# Patient Record
Sex: Male | Born: 1970 | Race: White | Hispanic: No | Smoking: Current every day smoker
Health system: Southern US, Community
[De-identification: ages and names within clinical notes are randomized; demographics above are authoritative.]

## PROBLEM LIST (undated history)

## (undated) DIAGNOSIS — T7840XA Allergy, unspecified, initial encounter: Secondary | ICD-10-CM

## (undated) DIAGNOSIS — M1990 Unspecified osteoarthritis, unspecified site: Secondary | ICD-10-CM

## (undated) HISTORY — DX: Unspecified osteoarthritis, unspecified site: M19.90

## (undated) HISTORY — PX: FACIAL FRACTURE SURGERY: SHX1570

## (undated) HISTORY — DX: Allergy, unspecified, initial encounter: T78.40XA

## (undated) HISTORY — PX: COLON SURGERY: SHX602

## (undated) HISTORY — PX: FRACTURE SURGERY: SHX138

---

## 2001-08-26 ENCOUNTER — Emergency Department (HOSPITAL_COMMUNITY): Admission: EM | Admit: 2001-08-26 | Discharge: 2001-08-26 | Payer: Self-pay | Admitting: Emergency Medicine

## 2002-05-31 HISTORY — PX: HEMORROIDECTOMY: SUR656

## 2003-05-16 ENCOUNTER — Ambulatory Visit (HOSPITAL_COMMUNITY): Admission: RE | Admit: 2003-05-16 | Discharge: 2003-05-16 | Payer: Self-pay | Admitting: General Surgery

## 2003-05-16 ENCOUNTER — Encounter (INDEPENDENT_AMBULATORY_CARE_PROVIDER_SITE_OTHER): Payer: Self-pay | Admitting: *Deleted

## 2007-10-01 ENCOUNTER — Emergency Department (HOSPITAL_COMMUNITY): Admission: EM | Admit: 2007-10-01 | Discharge: 2007-10-01 | Payer: Self-pay | Admitting: Emergency Medicine

## 2007-10-18 ENCOUNTER — Ambulatory Visit (HOSPITAL_COMMUNITY): Admission: RE | Admit: 2007-10-18 | Discharge: 2007-10-18 | Payer: Self-pay | Admitting: Cardiology

## 2007-10-18 ENCOUNTER — Encounter (INDEPENDENT_AMBULATORY_CARE_PROVIDER_SITE_OTHER): Payer: Self-pay | Admitting: Cardiology

## 2007-10-19 ENCOUNTER — Ambulatory Visit (HOSPITAL_COMMUNITY): Admission: RE | Admit: 2007-10-19 | Discharge: 2007-10-19 | Payer: Self-pay | Admitting: Cardiology

## 2007-11-03 ENCOUNTER — Ambulatory Visit: Payer: Self-pay | Admitting: Oncology

## 2007-11-17 LAB — CBC WITH DIFFERENTIAL/PLATELET
Basophils Absolute: 0 10*3/uL (ref 0.0–0.1)
Eosinophils Absolute: 0.1 10*3/uL (ref 0.0–0.5)
HCT: 50.7 % — ABNORMAL HIGH (ref 38.7–49.9)
HGB: 18.1 g/dL — ABNORMAL HIGH (ref 13.0–17.1)
LYMPH%: 24.1 % (ref 14.0–48.0)
MCV: 97 fL (ref 81.6–98.0)
MONO#: 0.5 10*3/uL (ref 0.1–0.9)
MONO%: 7.6 % (ref 0.0–13.0)
NEUT#: 4.5 10*3/uL (ref 1.5–6.5)
NEUT%: 66 % (ref 40.0–75.0)
Platelets: 217 10*3/uL (ref 145–400)

## 2007-11-17 LAB — MORPHOLOGY: PLT EST: ADEQUATE

## 2007-11-18 LAB — SEDIMENTATION RATE: Sed Rate: 2 mm/hr (ref 0–16)

## 2007-11-21 LAB — COMPREHENSIVE METABOLIC PANEL
ALT: 17 U/L (ref 0–53)
AST: 18 U/L (ref 0–37)
CO2: 23 mEq/L (ref 19–32)
Calcium: 9.8 mg/dL (ref 8.4–10.5)
Chloride: 107 mEq/L (ref 96–112)
Sodium: 142 mEq/L (ref 135–145)
Total Protein: 7.7 g/dL (ref 6.0–8.3)

## 2007-11-21 LAB — LACTATE DEHYDROGENASE: LDH: 128 U/L (ref 94–250)

## 2007-11-21 LAB — URIC ACID: Uric Acid, Serum: 6.7 mg/dL (ref 4.0–7.8)

## 2007-11-21 LAB — JAK2 EXONS 12 & 13 MUTATION, REFLEX: JAK2 Exons 12 & 13: 13

## 2007-11-24 ENCOUNTER — Ambulatory Visit: Admission: RE | Admit: 2007-11-24 | Discharge: 2007-11-24 | Payer: Self-pay | Admitting: Oncology

## 2007-12-05 LAB — CBC WITH DIFFERENTIAL/PLATELET
Basophils Absolute: 0 10*3/uL (ref 0.0–0.1)
Eosinophils Absolute: 0 10*3/uL (ref 0.0–0.5)
HCT: 48.4 % (ref 38.7–49.9)
MCH: 34.4 pg — ABNORMAL HIGH (ref 28.0–33.4)
MONO#: 0.5 10*3/uL (ref 0.1–0.9)
MONO%: 8.2 % (ref 0.0–13.0)
Platelets: 201 10*3/uL (ref 145–400)
RDW: 13.6 % (ref 11.2–14.6)
lymph#: 1.4 10*3/uL (ref 0.9–3.3)

## 2008-03-04 ENCOUNTER — Ambulatory Visit: Payer: Self-pay | Admitting: Oncology

## 2008-06-04 ENCOUNTER — Ambulatory Visit: Payer: Self-pay | Admitting: Oncology

## 2010-10-16 NOTE — Op Note (Signed)
NAME:  Gary Pratt, Gary Pratt                         ACCOUNT NO.:  [id number]   MEDICAL RECORD NO.:  [id number]                   PATIENT TYPE:  AMB   LOCATION:  DAY                                  FACILITY:  [facility]   PHYSICIAN:  Timothy E. Earlene Plater, M.D.              DATE OF BIRTH:  11/20/1970   DATE OF PROCEDURE:  05/16/2003  DATE OF DISCHARGE:                                 OPERATIVE REPORT   PREOPERATIVE DIAGNOSES:  Internal and external hemorrhoids.   POSTOPERATIVE DIAGNOSES:  Internal and external hemorrhoids.   PROCEDURE:  Hemorrhoidectomy.   SURGEON:  Timothy E. Earlene Plater, M.D.   ANESTHESIA:  General.   Mr. Frazer is 30, otherwise healthy, thin and without other risk factors who  has developed over time considerable pain, protrusion, soilage, bleeding  with hemorrhoids.  He has been seen and counseled in the office and wishes  to proceed with this surgery which has been carefully explained.  He was  identified, permit signed, evaluated by anesthesia.   He was taken to the operating room, placed supine, LMA anesthesia provided.  He was placed in lithotomy, perianal area inspected, prepped and draped in  the usual fashion. A large internal and external right anterior hemorrhoid  was present with easy prolapse.  There were four external tags located in  approximately the 3, 5, 7, and 9 o'clock positions.  The perianal area was  injected around and about with Marcaine, epinephrine, and Wydase and  massaged in well.  The external hemorrhoidal tags were clipped off flush  with the skin, the superficial varicosities removed.  The right anterior  internal and external hemorrhoid was excised in a standard fashion avoiding  the sphincter of course and the wound was closed with a running 2-0 chromic  including the portion of excised external skin.  The areas of tag excision  were closed with interrupted and/or running 3-0 plain suture.  The results  were satisfactory, the sphincter was intact,  there was no bleeding or  complication. Gelfoam gauze and a dry sterile dressing applied. He tolerated  it well and was removed to the recovery room in good condition.   Written and verbal instructions were given including Percocet 5 mg #36.  He  will be seen and followed as an outpatient.                                               Timothy E. Earlene Plater, M.D.    TED/MEDQ  D:  05/16/2003  T:  05/16/2003  Job:  [id number]

## 2011-02-25 LAB — BLOOD GAS, ARTERIAL
Acid-Base Excess: 0.8
Drawn by: 10179
pCO2 arterial: 31.3 — ABNORMAL LOW
pH, Arterial: 7.478 — ABNORMAL HIGH
pO2, Arterial: 106 — ABNORMAL HIGH

## 2020-12-24 ENCOUNTER — Other Ambulatory Visit: Payer: Self-pay | Admitting: *Deleted

## 2020-12-24 DIAGNOSIS — Z87891 Personal history of nicotine dependence: Secondary | ICD-10-CM

## 2020-12-24 DIAGNOSIS — F17210 Nicotine dependence, cigarettes, uncomplicated: Secondary | ICD-10-CM

## 2021-01-26 ENCOUNTER — Ambulatory Visit
Admission: RE | Admit: 2021-01-26 | Discharge: 2021-01-26 | Disposition: A | Discharge status: Home / Self Care | Payer: 59 | Source: Ambulatory Visit | Attending: Acute Care | Admitting: Acute Care

## 2021-01-26 ENCOUNTER — Other Ambulatory Visit: Payer: Self-pay

## 2021-01-26 ENCOUNTER — Telehealth (INDEPENDENT_AMBULATORY_CARE_PROVIDER_SITE_OTHER): Payer: 59 | Admitting: Acute Care

## 2021-01-26 ENCOUNTER — Encounter: Payer: Self-pay | Admitting: Acute Care

## 2021-01-26 DIAGNOSIS — F17210 Nicotine dependence, cigarettes, uncomplicated: Secondary | ICD-10-CM

## 2021-01-26 DIAGNOSIS — Z87891 Personal history of nicotine dependence: Secondary | ICD-10-CM

## 2021-01-26 NOTE — Progress Notes (Signed)
Shared Decision Making Visit Lung Cancer Screening Program [phone])   Eligibility: Age 50 y.o. Pack Years Smoking History Calculation 24 pack year smoking history (# packs/per year x # years smoked) Recent History of coughing up blood  no Unexplained weight loss? no ( >Than 15 pounds within the last 6 months ) Prior History Lung / other cancer no (Diagnosis within the last 5 years already requiring surveillance chest CT Scans). Smoking Status Current Smoker Former Smokers: Years since quit: NA  Quit Date: NA  Visit Components: Discussion included one or more decision making aids. yes Discussion included risk/benefits of screening. yes Discussion included potential follow up diagnostic testing for abnormal scans. yes Discussion included meaning and risk of over diagnosis. yes Discussion included meaning and risk of False Positives. yes Discussion included meaning of total radiation exposure. yes  Counseling Included: Importance of adherence to annual lung cancer LDCT screening. yes Impact of comorbidities on ability to participate in the program. yes Ability and willingness to under diagnostic treatment. yes  Smoking Cessation Counseling: Current Smokers:  Discussed importance of smoking cessation. yes Information about tobacco cessation classes and interventions provided to patient. yes Patient provided with "ticket" for LDCT Scan. yes Symptomatic Patient. no  Counseling NA Diagnosis Code: Tobacco Use Z72.0 Asymptomatic Patient yes  Counseling (Intermediate counseling: > three minutes counseling) E8315 Former Smokers:  Discussed the importance of maintaining cigarette abstinence. yes Diagnosis Code: Personal History of Nicotine Dependence. V76.160 Information about tobacco cessation classes and interventions provided to patient. Yes Patient provided with "ticket" for LDCT Scan. yes Written Order for Lung Cancer Screening with LDCT placed in Epic. Yes (CT Chest Lung Cancer  Screening Low Dose W/O CM) [id number] Z12.2-Screening of respiratory organs Z87.891-Personal history of nicotine dependence  I have spent 25 minutes of face to face time with Gary Pratt discussing the risks and benefits of lung cancer screening. We viewed a power point together that explained in detail the above noted topics. We paused at intervals to allow for questions to be asked and answered to ensure understanding.We discussed that the single most powerful action that he can take to decrease his risk of developing lung cancer is to quit smoking. We discussed whether or not he is ready to commit to setting a quit date. We discussed options for tools to aid in quitting smoking including nicotine replacement therapy, non-nicotine medications, support groups, Quit Smart classes, and behavior modification. We discussed that often times setting smaller, more achievable goals, such as eliminating 1 cigarette a day for a week and then 2 cigarettes a day for a week can be helpful in slowly decreasing the number of cigarettes smoked. This allows for a sense of accomplishment as well as providing a clinical benefit. I gave him the " Be Stronger Than Your Excuses" card with contact information for community resources, classes, free nicotine replacement therapy, and access to mobile apps, text messaging, and on-line smoking cessation help. I have also given him my card and contact information in the event he needs to contact me. We discussed the time and location of the scan, and that either Abigail Miyamoto RN or I will call with the results within 24-48 hours of receiving them. I have offered him  a copy of the power point we viewed  as a resource in the event they need reinforcement of the concepts we discussed today in the office. The patient verbalized understanding of all of  the above and had no further questions upon leaving the office. They  have my contact information in the event they have any further  questions.  I spent 3 minutes counseling on smoking cessation and the health risks of continued tobacco abuse.  I explained to the patient that there has been a high incidence of coronary artery disease noted on these exams. I explained that this is a non-gated exam therefore degree or severity cannot be determined. This patient is not currently on statin therapy. I have asked the patient to follow-up with their PCP regarding any incidental finding of coronary artery disease and management with diet or medication as their PCP  feels is clinically indicated. The patient verbalized understanding of the above and had no further questions upon completion of the visit.      Bevelyn Ngo, NP 01/26/2021

## 2021-01-26 NOTE — Patient Instructions (Signed)
Thank you for participating in the [facility] Screening Program. It was our pleasure to meet you today. We will call you with the results of your scan within the next few days. Your scan will be assigned a Lung RADS category score by the physicians reading the scans.  This Lung RADS score determines follow up scanning.  See below for description of categories, and follow up screening recommendations. We will be in touch to schedule your follow up screening annually or based on recommendations of our providers. We will fax a copy of your scan results to your Primary Care Physician, or the physician who referred you to the program, to ensure they have the results. Please call the office if you have any questions or concerns regarding your scanning experience or results.  Our office number is [phone]. Please speak with Denise Phelps, RN. She is our Lung Cancer Screening RN. If she is unavailable when you call, please have the office staff send her a message. She will return your call at her earliest convenience. Remember, if your scan is normal, we will scan you annually as long as you continue to meet the criteria for the program. (Age 55-77, Current smoker or smoker who has quit within the last 15 years). If you are a smoker, remember, quitting is the single most powerful action that you can take to decrease your risk of lung cancer and other pulmonary, breathing related problems. We know quitting is hard, and we are here to help.  Please let us know if there is anything we can do to help you meet your goal of quitting. If you are a former smoker, congratulations. We are proud of you! Remain smoke free! Remember you can refer friends or family members through the number above.  We will screen them to make sure they meet criteria for the program. Thank you for helping us take better care of you by participating in Lung Screening.  Lung RADS Categories:  Lung RADS 1: no nodules  or definitely non-concerning nodules.  Recommendation is for a repeat annual scan in 12 months.  Lung RADS 2:  nodules that are non-concerning in appearance and behavior with a very low likelihood of becoming an active cancer. Recommendation is for a repeat annual scan in 12 months.  Lung RADS 3: nodules that are probably non-concerning , includes nodules with a low likelihood of becoming an active cancer.  Recommendation is for a 6-month repeat screening scan. Often noted after an upper respiratory illness. We will be in touch to make sure you have no questions, and to schedule your 6-month scan.  Lung RADS 4 A: nodules with concerning findings, recommendation is most often for a follow up scan in 3 months or additional testing based on our provider's assessment of the scan. We will be in touch to make sure you have no questions and to schedule the recommended 3 month follow up scan.  Lung RADS 4 B:  indicates findings that are concerning. We will be in touch with you to schedule additional diagnostic testing based on our provider's  assessment of the scan.   

## 2021-02-05 NOTE — Progress Notes (Signed)
Please call patient and let them  know their  low dose Ct was read as a Lung RADS 1, negative study: no nodules or definitely benign nodules. Radiology recommendation is for a repeat LDCT in 12 months. .Please let them  know we will order and schedule their  annual screening scan for 12/2021.  Please place order for annual  screening scan for  12/2021 and fax results to PCP. Thanks so much. 

## 2021-02-06 ENCOUNTER — Encounter: Payer: Self-pay | Admitting: *Deleted

## 2021-02-06 DIAGNOSIS — F17210 Nicotine dependence, cigarettes, uncomplicated: Secondary | ICD-10-CM

## 2021-02-06 DIAGNOSIS — Z87891 Personal history of nicotine dependence: Secondary | ICD-10-CM

## 2021-03-10 NOTE — Progress Notes (Signed)
Cardiology Office Note:   Date:  03/11/2021  NAME:  Gary Pratt    MRN: [id number] DOB:  Sep 22, 1970   PCP:  Ollen Bowl, MD  Cardiologist:  None  Electrophysiologist:  None   Referring MD: Ollen Bowl, MD   Chief Complaint  Patient presents with   Chest Pain    History of Present Illness:   PEACE JOST is a 50 y.o. male with a hx of tobacco abuse who is being seen today for the evaluation of chest pain at the request of Pahwani, Rinka R, MD. he reports for the last 2 years has had intermittent episodes of chest tightness.  He informs me that it occurs randomly.  No identifiable triggers or alleviating factors.  He reports it is described as a sharp pain that jumps across his chest.  He does endorse it being associated with stress.  Symptoms can last up to 5 minutes.  No alleviating factors reported.  Does not appear to be exertional.  Not alleviated by rest.  He has no medical problems other than significant tobacco abuse.  He smoked for 30+ years.  He does work in Set designer as a Merchandiser, retail.  Reports high stress job.  EKG in office demonstrates normal sinus rhythm with no acute ischemic changes.  He has a very strong family history of heart disease.  Cholesterol is elevated but he takes no medication.  He is not diabetic.  He is engaged.  No children.  Blood pressure well controlled on medications.  He is very worried about his heart given his very strong family history.  Problem List Tobacco abuse HLD -T chol 183, HDL 37, LDL 124, TG 119, A1c 5.1  CT Lung Cancer screening 01/27/2021 showed no evidence of coronary calcium.   Past Medical History: History reviewed. No pertinent past medical history.  Past Surgical History: Past Surgical History:  Procedure Laterality Date   FACIAL FRACTURE SURGERY      Current Medications: Current Meds  Medication Sig   metoprolol tartrate (LOPRESSOR) 50 MG tablet Take 1 tablet (50 mg total) by mouth as directed. Take 2  Hours prior to CT Scan     Allergies:    Sulfa antibiotics   Social History: Social History   Socioeconomic History   Marital status: Significant Other    Spouse name: Not on file   Number of children: Not on file   Years of education: Not on file   Highest education level: Not on file  Occupational History   Not on file  Tobacco Use   Smoking status: Every Day    Packs/day: 0.75    Years: 32.00    Pack years: 24.00    Types: Cigarettes   Smokeless tobacco: Never  Substance and Sexual Activity   Alcohol use: Yes   Drug use: Never   Sexual activity: Never  Other Topics Concern   Not on file  Social History Narrative   Works at [employer]    Social Determinants of Corporate investment banker Strain: Not on file  Food Insecurity: Not on file  Transportation Needs: Not on file  Physical Activity: Not on file  Stress: Not on file  Social Connections: Not on file     Family History: The patient's family history includes Heart attack in his paternal grandfather and paternal grandmother; Heart attack (age of onset: 30) in his father.  ROS:   All other ROS reviewed and negative. Pertinent positives noted in the HPI.  EKGs/Labs/Other Studies Reviewed:   The following studies were personally reviewed by me today:  EKG:  EKG is ordered today.  The ekg ordered today demonstrates normal sinus rhythm heart rate 65, nonspecific ST-T changes, and was personally reviewed by me.   Recent Labs: 03/11/2021: BUN 12; Creatinine, Ser 1.10; Potassium 4.0; Sodium 141   Recent Lipid Panel No results found for: CHOL, TRIG, HDL, CHOLHDL, VLDL, LDLCALC, LDLDIRECT  Physical Exam:   VS:  BP 136/80   Pulse 65   Ht 5\' 11"  (1.803 m)   Wt 158 lb 3.2 oz (71.8 kg)   SpO2 98%   BMI 22.06 kg/m    Wt Readings from Last 3 Encounters:  03/11/21 158 lb 3.2 oz (71.8 kg)    General: Well nourished, well developed, in no acute distress Head: Atraumatic, normal size  Eyes: PEERLA, EOMI   Neck: Supple, no JVD Endocrine: No thryomegaly Cardiac: Normal S1, S2; RRR; no murmurs, rubs, or gallops Lungs: Clear to auscultation bilaterally, no wheezing, rhonchi or rales  Abd: Soft, nontender, no hepatomegaly  Ext: No edema, pulses 2+ Musculoskeletal: No deformities, BUE and BLE strength normal and equal Skin: Warm and dry, no rashes   Neuro: Alert and oriented to person, place, time, and situation, CNII-XII grossly intact, no focal deficits  Psych: Normal mood and affect   ASSESSMENT:   Gary Pratt is a 50 y.o. male who presents for the following: 1. Precordial pain   2. Tobacco abuse   3. Chest pain, unspecified type     PLAN:   1. Precordial pain -Chest pain, possibly cardiac.  EKG with nonspecific ST-T changes.  Symptoms are not classic for angina.  Risk factors include significant tobacco abuse and very strong family history.  Recent CT lung cancer screening shows no evidence of coronary calcium.  I think to sort out what is going on I would recommend coronary CTA.  He will take 100 mg of metoprolol tartrate twice before the scan.  We will see him back as needed based on the results of his scan.  2. Tobacco abuse -30+ pack year smoking.  Smoking cessation counseling was provided today in office of 5-minute duration.     Disposition: Return if symptoms worsen or fail to improve.  Medication Adjustments/Labs and Tests Ordered: Current medicines are reviewed at length with the patient today.  Concerns regarding medicines are outlined above.  Orders Placed This Encounter  Procedures   CT CORONARY MORPH W/CTA COR W/SCORE W/CA W/CM &/OR WO/CM   Basic metabolic panel   EKG 12-Lead   Meds ordered this encounter  Medications   metoprolol tartrate (LOPRESSOR) 50 MG tablet    Sig: Take 1 tablet (50 mg total) by mouth as directed. Take 2 Hours prior to CT Scan    Dispense:  1 tablet    Refill:  0    Patient Instructions  Medication Instructions:  Your physician  recommends that you continue on your current medications as directed. Please refer to the Current Medication list given to you today.  Take Lopressor 50 mg 2 hours prior to CT Scan   *If you need a refill on your cardiac medications before your next appointment, please call your pharmacy*   Lab Work: Your physician recommends that you return for lab work in: Today 44)   If you have labs (blood work) drawn today and your tests are completely normal, you will receive your results only by: MyChart Message (if you have MyChart) OR A  paper copy in the mail If you have any lab test that is abnormal or we need to change your treatment, we will call you to review the results.   Testing/Procedures: Cardiac CTA    Follow-Up: At [facility], you and your health needs are our priority.  As part of our continuing mission to provide you with exceptional heart care, we have created designated Provider Care Teams.  These Care Teams include your primary Cardiologist (physician) and Advanced Practice Providers (APPs -  Physician Assistants and Nurse Practitioners) who all work together to provide you with the care you need, when you need it.  We recommend signing up for the patient portal called "MyChart".  Sign up information is provided on this After Visit Summary.  MyChart is used to connect with patients for Virtual Visits (Telemedicine).  Patients are able to view lab/test results, encounter notes, upcoming appointments, etc.  Non-urgent messages can be sent to your provider as well.   To learn more about what you can do with MyChart, go to [email or web address].    Your next appointment:    As Needed   The format for your next appointment:   In Person  Provider:   Dr. Flora Lipps    Other Instructions Thank you for choosing [facility]!     Your cardiac CT will be scheduled at one of the below locations:   [facility] [address] [place], [place]  [address] [phone]  OR  [facility] [address] [place], [place] [address] [phone]  If scheduled at [facility], please arrive at the [facility] main entrance (entrance A) of [facility] 30 minutes prior to test start time. Proceed to the [facility] Radiology Department (first floor) to check-in and test prep.  If scheduled at [facility], please arrive 15 mins early for check-in and test prep.  Please follow these instructions carefully (unless otherwise directed):  Hold all erectile dysfunction medications at least 3 days (72 hrs) prior to test.  On the Night Before the Test: Be sure to Drink plenty of water. Do not consume any caffeinated/decaffeinated beverages or chocolate 12 hours prior to your test. Do not take any antihistamines 12 hours prior to your test.  On the Day of the Test: Drink plenty of water until 1 hour prior to the test. Do not eat any food 4 hours prior to the test. You may take your regular medications prior to the test.  Take metoprolol (Lopressor) two hours prior to test. HOLD Furosemide/Hydrochlorothiazide morning of the test.   *For Clinical Staff only. Please instruct patient the following:* Heart Rate Medication Recommendations for Cardiac CT  Resting HR < 50 bpm  No medication  Resting HR 50-60 bpm and BP >110/50 mmHG   Consider Metoprolol tartrate 25 mg PO 90-120 min prior to scan  Resting HR 60-65 bpm and BP >110/50 mmHG  Metoprolol tartrate 50 mg PO 90-120 minutes prior to scan   Resting HR > 65 bpm and BP >110/50 mmHG  Metoprolol tartrate 100 mg PO 90-120 minutes prior to scan  Consider Ivabradine 10-15 mg PO or a calcium channel blocker for resting HR >60 bpm and contraindication to metoprolol tartrate  Consider Ivabradine 10-15 mg PO in combination with metoprolol tartrate for HR >80 bpm         After the Test: Drink plenty of  water. After receiving IV contrast, you may experience a mild flushed  feeling. This is normal. On occasion, you may experience a mild rash up to 24 hours after the test. This is not dangerous. If this occurs, you can take Benadryl 25 mg and increase your fluid intake. If you experience trouble breathing, this can be serious. If it is severe call 911 IMMEDIATELY. If it is mild, please call our office. If you take any of these medications: Glipizide/Metformin, Avandament, Glucavance, please do not take 48 hours after completing test unless otherwise instructed.  Please allow 2-4 weeks for scheduling of routine cardiac CTs. Some insurance companies require a pre-authorization which may delay scheduling of this test.   For non-scheduling related questions, please contact the cardiac imaging nurse navigator should you have any questions/concerns: Rockwell Alexandria, Cardiac Imaging Nurse Navigator Larey Brick, Cardiac Imaging Nurse Navigator [facility] Direct Office Dial: [phone]   For scheduling needs, including cancellations and rescheduling, please call [place], [phone].    Signed, Lenna Gilford. Flora Lipps, MD, [facility]  [facility]  [address], [address] [place], [place] [address] [phone]  03/11/2021 4:02 PM

## 2021-03-11 ENCOUNTER — Other Ambulatory Visit (HOSPITAL_COMMUNITY)
Admission: RE | Admit: 2021-03-11 | Discharge: 2021-03-11 | Disposition: A | Discharge status: Home / Self Care | Payer: 59 | Source: Ambulatory Visit | Attending: Cardiovascular Disease | Admitting: Cardiovascular Disease

## 2021-03-11 ENCOUNTER — Encounter: Payer: Self-pay | Admitting: Cardiovascular Disease

## 2021-03-11 ENCOUNTER — Ambulatory Visit: Payer: 59 | Admitting: Cardiovascular Disease

## 2021-03-11 ENCOUNTER — Other Ambulatory Visit: Payer: Self-pay

## 2021-03-11 VITALS — BP 136/80 | HR 65 | Ht 71.0 in | Wt 158.2 lb

## 2021-03-11 DIAGNOSIS — R079 Chest pain, unspecified: Secondary | ICD-10-CM

## 2021-03-11 DIAGNOSIS — R072 Precordial pain: Secondary | ICD-10-CM

## 2021-03-11 DIAGNOSIS — Z720 Tobacco use: Secondary | ICD-10-CM

## 2021-03-11 LAB — BASIC METABOLIC PANEL
Anion gap: 6 (ref 5–15)
BUN: 12 mg/dL (ref 6–20)
CO2: 28 mmol/L (ref 22–32)
Calcium: 8.9 mg/dL (ref 8.9–10.3)
Chloride: 107 mmol/L (ref 98–111)
Creatinine, Ser: 1.1 mg/dL (ref 0.61–1.24)
GFR, Estimated: >60 mL/min (ref >60)
Glucose, Bld: 96 mg/dL (ref 70–99)
Potassium: 4 mmol/L (ref 3.5–5.1)
Sodium: 141 mmol/L (ref 135–145)

## 2021-03-11 MED ORDER — METOPROLOL TARTRATE 50 MG PO TABS: 50.0000 mg | ORAL_TABLET | ORAL | 0 refills | Status: DC

## 2021-03-11 NOTE — Patient Instructions (Addendum)
Medication Instructions:  Your physician recommends that you continue on your current medications as directed. Please refer to the Current Medication list given to you today.  Take Lopressor 50 mg 2 hours prior to CT Scan   *If you need a refill on your cardiac medications before your next appointment, please call your pharmacy*   Lab Work: Your physician recommends that you return for lab work in: Today [employer] trader)   If you have labs (blood work) drawn today and your tests are completely normal, you will receive your results only by: MyChart Message (if you have MyChart) OR A paper copy in the mail If you have any lab test that is abnormal or we need to change your treatment, we will call you to review the results.   Testing/Procedures: Cardiac CTA    Follow-Up: At [facility], you and your health needs are our priority.  As part of our continuing mission to provide you with exceptional heart care, we have created designated Provider Care Teams.  These Care Teams include your primary Cardiologist (physician) and Advanced Practice Providers (APPs -  Physician Assistants and Nurse Practitioners) who all work together to provide you with the care you need, when you need it.  We recommend signing up for the patient portal called "MyChart".  Sign up information is provided on this After Visit Summary.  MyChart is used to connect with patients for Virtual Visits (Telemedicine).  Patients are able to view lab/test results, encounter notes, upcoming appointments, etc.  Non-urgent messages can be sent to your provider as well.   To learn more about what you can do with MyChart, go to [email or web address].    Your next appointment:    As Needed   The format for your next appointment:   In Person  Provider:   Dr. Flora Lipps    Other Instructions Thank you for choosing [facility]!     Your cardiac CT will be scheduled at one of the below locations:   [facility] [address] [place], [place] [address] [id number]  OR  [facility] [address] [place], [place] [address] [phone]  If scheduled at [facility], please arrive at the [facility] main entrance (entrance A) of [facility] 30 minutes prior to test start time. Proceed to the [facility] Radiology Department (first floor) to check-in and test prep.  If scheduled at [facility], please arrive 15 mins early for check-in and test prep.  Please follow these instructions carefully (unless otherwise directed):  Hold all erectile dysfunction medications at least 3 days (72 hrs) prior to test.  On the Night Before the Test: Be sure to Drink plenty of water. Do not consume any caffeinated/decaffeinated beverages or chocolate 12 hours prior to your test. Do not take any antihistamines 12 hours prior to your test.  On the Day of the Test: Drink plenty of water until 1 hour prior to the test. Do not eat any food 4 hours prior to the test. You may take your regular medications prior to the test.  Take metoprolol (Lopressor) two hours prior to test. HOLD Furosemide/Hydrochlorothiazide morning of the test.   *For Clinical Staff only. Please instruct patient the following:* Heart Rate Medication Recommendations for Cardiac CT  Resting HR < 50 bpm  No medication  Resting HR 50-60 bpm and BP >110/50 mmHG   Consider Metoprolol tartrate 25 mg PO 90-120 min prior to scan  Resting HR 60-65 bpm and BP >110/50 mmHG  Metoprolol tartrate 50 mg PO 90-120 minutes prior to scan   Resting HR > 65 bpm and BP >110/50 mmHG  Metoprolol tartrate 100 mg PO 90-120 minutes prior to scan  Consider Ivabradine 10-15 mg PO or a calcium channel blocker for resting HR >60 bpm and contraindication to metoprolol tartrate  Consider Ivabradine 10-15 mg PO in combination with metoprolol tartrate for HR >80 bpm          After the Test: Drink plenty of water. After receiving IV contrast, you may experience a mild flushed feeling. This is normal. On occasion, you may experience a mild rash up to 24 hours after the test. This is not dangerous. If this occurs, you can take Benadryl 25 mg and increase your fluid intake. If you experience trouble breathing, this can be serious. If it is severe call 911 IMMEDIATELY. If it is mild, please call our office. If you take any of these medications: Glipizide/Metformin, Avandament, Glucavance, please do not take 48 hours after completing test unless otherwise instructed.  Please allow 2-4 weeks for scheduling of routine cardiac CTs. Some insurance companies require a pre-authorization which may delay scheduling of this test.   For non-scheduling related questions, please contact the cardiac imaging nurse navigator should you have any questions/concerns: Rockwell Alexandria, Cardiac Imaging Nurse Navigator Larey Brick, Cardiac Imaging Nurse Navigator [facility] Direct Office Dial: [phone]   For scheduling needs, including cancellations and rescheduling, please call [place], [phone].

## 2021-03-18 ENCOUNTER — Telehealth (HOSPITAL_COMMUNITY): Payer: Self-pay | Admitting: Emergency Medicine

## 2021-03-18 NOTE — Telephone Encounter (Signed)
Reaching out to patient to offer assistance regarding upcoming cardiac imaging study; pt verbalizes understanding of appt date/time, parking situation and where to check in, pre-test NPO status and medications ordered, and verified current allergies; name and call back number provided for further questions should they arise Gary Ernster RN Navigator Cardiac Imaging [facility] [phone] office [phone] cell  50mg metoprolol  Denies iv issues Denies claustro  

## 2021-03-19 ENCOUNTER — Ambulatory Visit (HOSPITAL_COMMUNITY)
Admission: RE | Admit: 2021-03-19 | Discharge: 2021-03-19 | Disposition: A | Discharge status: Home / Self Care | Payer: 59 | Source: Ambulatory Visit | Attending: Cardiovascular Disease | Admitting: Cardiovascular Disease

## 2021-03-19 ENCOUNTER — Other Ambulatory Visit: Payer: Self-pay

## 2021-03-19 DIAGNOSIS — R079 Chest pain, unspecified: Secondary | ICD-10-CM

## 2021-03-19 MED ORDER — NITROGLYCERIN 0.4 MG SL SUBL
SUBLINGUAL_TABLET | SUBLINGUAL | Status: AC
  Filled 2021-03-19: qty 2

## 2021-03-19 MED ORDER — IOHEXOL 350 MG/ML SOLN
95.0000 mL | Freq: Once | INTRAVENOUS | Status: AC | PRN
  Administered 2021-03-19: 95 mL via INTRAVENOUS

## 2021-03-19 MED ORDER — NITROGLYCERIN 0.4 MG SL SUBL
0.8000 mg | SUBLINGUAL_TABLET | Freq: Once | SUBLINGUAL | Status: AC
  Administered 2021-03-19: 0.8 mg via SUBLINGUAL

## 2021-03-19 NOTE — Progress Notes (Signed)
CT scan completed. Tolerated well. D/C home ambulatory, awake and alert. In no distress. 

## 2022-01-13 ENCOUNTER — Encounter (HOSPITAL_BASED_OUTPATIENT_CLINIC_OR_DEPARTMENT_OTHER): Payer: Self-pay | Admitting: Surgery

## 2022-01-13 NOTE — Progress Notes (Addendum)
Spoke w/ via phone for pre-op interview--- Gary Pratt needs dos---- Surgeon orders pending.           Lab results------ COVID test -----patient states asymptomatic no test needed Arrive at -------0630 NPO after MN NO Solid Food.   Med rec completed Medications to take morning of surgery ----- NONE Diabetic medication ----- Patient instructed no nail polish to be worn day of surgery Patient instructed to bring photo id and insurance card day of surgery Patient aware to have Driver (ride ) / caregiver Gary Pratt    for 24 hours after surgery  Patient Special Instructions ----- Pre-Op special Istructions ----- Patient verbalized understanding of instructions that were given at this phone interview. Patient denies shortness of breath, chest pain, fever, cough at this phone interview.

## 2022-01-20 ENCOUNTER — Ambulatory Visit: Payer: Self-pay | Admitting: Surgery

## 2022-01-20 DIAGNOSIS — Z01818 Encounter for other preprocedural examination: Secondary | ICD-10-CM

## 2022-01-26 ENCOUNTER — Inpatient Hospital Stay: Admission: RE | Admit: 2022-01-26 | Payer: 59 | Source: Ambulatory Visit

## 2022-01-26 NOTE — Anesthesia Preprocedure Evaluation (Signed)
Anesthesia Evaluation  Patient identified by MRN, date of birth, ID band Patient awake    Reviewed: Allergy & Precautions, NPO status , Patient's Chart, lab work & pertinent test results  Airway Mallampati: II  TM Distance: >3 FB Neck ROM: Full    Dental no notable dental hx. (+) Dental Advisory Given, Teeth Intact   Pulmonary Current Smoker,  Current smoker, 24 pack year history    Pulmonary exam normal breath sounds clear to auscultation       Cardiovascular negative cardio ROS Normal cardiovascular exam Rhythm:Regular Rate:Normal     Neuro/Psych negative neurological ROS  negative psych ROS   GI/Hepatic negative GI ROS, Neg liver ROS,   Endo/Other  negative endocrine ROS  Renal/GU negative Renal ROS  negative genitourinary   Musculoskeletal negative musculoskeletal ROS (+)   Abdominal   Peds  Hematology negative hematology ROS (+)   Anesthesia Other Findings   Reproductive/Obstetrics negative OB ROS                            Anesthesia Physical Anesthesia Plan  ASA: 2  Anesthesia Plan: General   Post-op Pain Management: Tylenol PO (pre-op)* and Toradol IV (intra-op)*   Induction: Intravenous  PONV Risk Score and Plan: 1 and Ondansetron, Dexamethasone, Midazolam and Treatment may vary due to age or medical condition  Airway Management Planned: LMA  Additional Equipment: None  Intra-op Plan:   Post-operative Plan: Extubation in OR  Informed Consent: I have reviewed the patients History and Physical, chart, labs and discussed the procedure including the risks, benefits and alternatives for the proposed anesthesia with the patient or authorized representative who has indicated his/her understanding and acceptance.     Dental advisory given  Plan Discussed with: CRNA  Anesthesia Plan Comments:        Anesthesia Quick Evaluation

## 2022-01-27 ENCOUNTER — Ambulatory Visit (HOSPITAL_BASED_OUTPATIENT_CLINIC_OR_DEPARTMENT_OTHER)
Admission: RE | Admit: 2022-01-27 | Discharge: 2022-01-27 | Disposition: A | Discharge status: Home / Self Care | Payer: 59 | Source: Ambulatory Visit | Attending: Surgery | Admitting: Surgery

## 2022-01-27 ENCOUNTER — Encounter (HOSPITAL_BASED_OUTPATIENT_CLINIC_OR_DEPARTMENT_OTHER): Payer: Self-pay | Admitting: Surgery

## 2022-01-27 ENCOUNTER — Other Ambulatory Visit: Payer: Self-pay

## 2022-01-27 ENCOUNTER — Ambulatory Visit (HOSPITAL_BASED_OUTPATIENT_CLINIC_OR_DEPARTMENT_OTHER): Payer: 59 | Admitting: Anesthesiology

## 2022-01-27 ENCOUNTER — Encounter (HOSPITAL_BASED_OUTPATIENT_CLINIC_OR_DEPARTMENT_OTHER)
Admission: RE | Disposition: A | Discharge status: Home / Self Care | Payer: Self-pay | Source: Ambulatory Visit | Attending: Surgery

## 2022-01-27 DIAGNOSIS — K649 Unspecified hemorrhoids: Secondary | ICD-10-CM

## 2022-01-27 DIAGNOSIS — F17210 Nicotine dependence, cigarettes, uncomplicated: Secondary | ICD-10-CM | POA: Diagnosis not present

## 2022-01-27 DIAGNOSIS — K644 Residual hemorrhoidal skin tags: Secondary | ICD-10-CM | POA: Insufficient documentation

## 2022-01-27 DIAGNOSIS — I10 Essential (primary) hypertension: Secondary | ICD-10-CM | POA: Diagnosis not present

## 2022-01-27 DIAGNOSIS — Z01818 Encounter for other preprocedural examination: Secondary | ICD-10-CM

## 2022-01-27 DIAGNOSIS — K643 Fourth degree hemorrhoids: Secondary | ICD-10-CM | POA: Insufficient documentation

## 2022-01-27 DIAGNOSIS — K642 Third degree hemorrhoids: Secondary | ICD-10-CM | POA: Diagnosis not present

## 2022-01-27 HISTORY — PX: RECTAL EXAM UNDER ANESTHESIA: SHX6399

## 2022-01-27 HISTORY — PX: HEMORRHOID SURGERY: SHX153

## 2022-01-27 LAB — CBC WITH DIFFERENTIAL/PLATELET
Abs Immature Granulocytes: 0.01 10*3/uL (ref 0.00–0.07)
Basophils Absolute: 0.1 10*3/uL (ref 0.0–0.1)
Basophils Relative: 1 %
Eosinophils Absolute: 0.2 10*3/uL (ref 0.0–0.5)
Eosinophils Relative: 2 %
HCT: 57.8 % — ABNORMAL HIGH (ref 39.0–52.0)
Hemoglobin: 19.6 g/dL — ABNORMAL HIGH (ref 13.0–17.0)
Immature Granulocytes: 0 %
Lymphocytes Relative: 25 %
Lymphs Abs: 1.8 10*3/uL (ref 0.7–4.0)
MCH: 34.9 pg — ABNORMAL HIGH (ref 26.0–34.0)
MCHC: 33.9 g/dL (ref 30.0–36.0)
MCV: 103 fL — ABNORMAL HIGH (ref 80.0–100.0)
Monocytes Absolute: 0.6 10*3/uL (ref 0.1–1.0)
Monocytes Relative: 8 %
Neutro Abs: 4.7 10*3/uL (ref 1.7–7.7)
Neutrophils Relative %: 64 %
Platelets: 191 10*3/uL (ref 150–400)
RBC: 5.61 MIL/uL (ref 4.22–5.81)
RDW: 13.8 % (ref 11.5–15.5)
WBC: 7.3 10*3/uL (ref 4.0–10.5)
nRBC: 0 % (ref 0.0–0.2)

## 2022-01-27 LAB — BASIC METABOLIC PANEL
Anion gap: 6 (ref 5–15)
BUN: 10 mg/dL (ref 6–20)
CO2: 25 mmol/L (ref 22–32)
Calcium: 9.1 mg/dL (ref 8.9–10.3)
Chloride: 110 mmol/L (ref 98–111)
Creatinine, Ser: 0.84 mg/dL (ref 0.61–1.24)
GFR, Estimated: >60 mL/min (ref >60)
Glucose, Bld: 101 mg/dL — ABNORMAL HIGH (ref 70–99)
Potassium: 3.4 mmol/L — ABNORMAL LOW (ref 3.5–5.1)
Sodium: 141 mmol/L (ref 135–145)

## 2022-01-27 SURGERY — HEMORRHOIDECTOMY
Anesthesia: General | Site: Rectum

## 2022-01-27 MED ORDER — PHENYLEPHRINE HCL (PRESSORS) 10 MG/ML IV SOLN
INTRAVENOUS | Status: DC | PRN
  Administered 2022-01-27 (×2): 80 ug via INTRAVENOUS

## 2022-01-27 MED ORDER — ACETAMINOPHEN 500 MG PO TABS
1000.0000 mg | ORAL_TABLET | ORAL | Status: AC
  Administered 2022-01-27: 1000 mg via ORAL

## 2022-01-27 MED ORDER — FENTANYL CITRATE (PF) 100 MCG/2ML IJ SOLN: 25.0000 ug | INTRAMUSCULAR | Status: DC | PRN

## 2022-01-27 MED ORDER — CHLORHEXIDINE GLUCONATE CLOTH 2 % EX PADS: 6.0000 | MEDICATED_PAD | Freq: Once | CUTANEOUS | Status: DC

## 2022-01-27 MED ORDER — 0.9 % SODIUM CHLORIDE (POUR BTL) OPTIME
TOPICAL | Status: DC | PRN
  Administered 2022-01-27: 500 mL

## 2022-01-27 MED ORDER — HYDROMORPHONE HCL 2 MG/ML IJ SOLN
INTRAMUSCULAR | Status: AC
  Filled 2022-01-27: qty 1

## 2022-01-27 MED ORDER — GLYCOPYRROLATE PF 0.2 MG/ML IJ SOSY
PREFILLED_SYRINGE | INTRAMUSCULAR | Status: DC | PRN
  Administered 2022-01-27: .2 mg via INTRAVENOUS

## 2022-01-27 MED ORDER — MIDAZOLAM HCL 5 MG/5ML IJ SOLN
INTRAMUSCULAR | Status: DC | PRN
  Administered 2022-01-27: 2 mg via INTRAVENOUS

## 2022-01-27 MED ORDER — ONDANSETRON HCL 4 MG/2ML IJ SOLN
4.0000 mg | Freq: Once | INTRAMUSCULAR | Status: DC | PRN
Start: 2022-01-27 — End: 2022-01-27

## 2022-01-27 MED ORDER — FENTANYL CITRATE (PF) 100 MCG/2ML IJ SOLN
INTRAMUSCULAR | Status: DC | PRN
  Administered 2022-01-27: 100 ug via INTRAVENOUS
  Administered 2022-01-27 (×2): 50 ug via INTRAVENOUS

## 2022-01-27 MED ORDER — LIDOCAINE HCL (PF) 2 % IJ SOLN
INTRAMUSCULAR | Status: AC
  Filled 2022-01-27: qty 5

## 2022-01-27 MED ORDER — FLEET ENEMA 7-19 GM/118ML RE ENEM
1.0000 | ENEMA | Freq: Once | RECTAL | Status: AC
  Administered 2022-01-27: 1 via RECTAL

## 2022-01-27 MED ORDER — PROPOFOL 10 MG/ML IV BOLUS
INTRAVENOUS | Status: AC
  Filled 2022-01-27: qty 20

## 2022-01-27 MED ORDER — KETOROLAC TROMETHAMINE 30 MG/ML IJ SOLN
INTRAMUSCULAR | Status: AC
  Filled 2022-01-27: qty 1

## 2022-01-27 MED ORDER — GLYCOPYRROLATE PF 0.2 MG/ML IJ SOSY
PREFILLED_SYRINGE | INTRAMUSCULAR | Status: AC
  Filled 2022-01-27: qty 1

## 2022-01-27 MED ORDER — CEFAZOLIN SODIUM-DEXTROSE 2-4 GM/100ML-% IV SOLN
2.0000 g | INTRAVENOUS | Status: AC
  Administered 2022-01-27: 2 g via INTRAVENOUS

## 2022-01-27 MED ORDER — FENTANYL CITRATE (PF) 100 MCG/2ML IJ SOLN
INTRAMUSCULAR | Status: AC
  Filled 2022-01-27: qty 2

## 2022-01-27 MED ORDER — EPHEDRINE 5 MG/ML INJ
INTRAVENOUS | Status: AC
  Filled 2022-01-27: qty 5

## 2022-01-27 MED ORDER — SODIUM CHLORIDE 0.9 % IV SOLN
INTRAVENOUS | Status: DC | PRN
  Administered 2022-01-27: 50 mL

## 2022-01-27 MED ORDER — TRAMADOL HCL 50 MG PO TABS: 50.0000 mg | ORAL_TABLET | Freq: Four times a day (QID) | ORAL | 0 refills | Status: AC | PRN

## 2022-01-27 MED ORDER — DEXAMETHASONE SODIUM PHOSPHATE 10 MG/ML IJ SOLN
INTRAMUSCULAR | Status: DC | PRN
  Administered 2022-01-27: 10 mg via INTRAVENOUS

## 2022-01-27 MED ORDER — LIDOCAINE 2% (20 MG/ML) 5 ML SYRINGE
INTRAMUSCULAR | Status: DC | PRN
  Administered 2022-01-27: 60 mg via INTRAVENOUS

## 2022-01-27 MED ORDER — OXYCODONE HCL 5 MG PO TABS: 5.0000 mg | ORAL_TABLET | Freq: Once | ORAL | Status: DC | PRN

## 2022-01-27 MED ORDER — OXYCODONE HCL 5 MG/5ML PO SOLN: 5.0000 mg | Freq: Once | ORAL | Status: DC | PRN

## 2022-01-27 MED ORDER — PROPOFOL 10 MG/ML IV BOLUS
INTRAVENOUS | Status: DC | PRN
  Administered 2022-01-27: 200 mg via INTRAVENOUS
  Administered 2022-01-27: 50 mg via INTRAVENOUS

## 2022-01-27 MED ORDER — BUPIVACAINE LIPOSOME 1.3 % IJ SUSP: 20.0000 mL | Freq: Once | INTRAMUSCULAR | Status: DC

## 2022-01-27 MED ORDER — ARTIFICIAL TEARS OPHTHALMIC OINT
TOPICAL_OINTMENT | OPHTHALMIC | Status: AC
  Filled 2022-01-27: qty 3.5

## 2022-01-27 MED ORDER — KETOROLAC TROMETHAMINE 30 MG/ML IJ SOLN
INTRAMUSCULAR | Status: DC | PRN
  Administered 2022-01-27: 30 mg via INTRAVENOUS

## 2022-01-27 MED ORDER — MIDAZOLAM HCL 2 MG/2ML IJ SOLN
INTRAMUSCULAR | Status: AC
  Filled 2022-01-27: qty 2

## 2022-01-27 MED ORDER — DEXAMETHASONE SODIUM PHOSPHATE 10 MG/ML IJ SOLN
INTRAMUSCULAR | Status: AC
  Filled 2022-01-27: qty 1

## 2022-01-27 MED ORDER — ROCURONIUM BROMIDE 10 MG/ML (PF) SYRINGE
PREFILLED_SYRINGE | INTRAVENOUS | Status: AC
  Filled 2022-01-27: qty 10

## 2022-01-27 MED ORDER — CEFAZOLIN SODIUM-DEXTROSE 2-4 GM/100ML-% IV SOLN
INTRAVENOUS | Status: AC
  Filled 2022-01-27: qty 100

## 2022-01-27 MED ORDER — EPHEDRINE SULFATE-NACL 50-0.9 MG/10ML-% IV SOSY
PREFILLED_SYRINGE | INTRAVENOUS | Status: DC | PRN
  Administered 2022-01-27 (×2): 5 mg via INTRAVENOUS

## 2022-01-27 MED ORDER — FLEET ENEMA 7-19 GM/118ML RE ENEM: 1.0000 | ENEMA | Freq: Once | RECTAL | Status: DC

## 2022-01-27 MED ORDER — HYDROMORPHONE HCL 1 MG/ML IJ SOLN
INTRAMUSCULAR | Status: DC | PRN
  Administered 2022-01-27: 1 mg via INTRAVENOUS

## 2022-01-27 MED ORDER — ACETAMINOPHEN 500 MG PO TABS
ORAL_TABLET | ORAL | Status: AC
  Filled 2022-01-27: qty 2

## 2022-01-27 MED ORDER — LACTATED RINGERS IV SOLN: INTRAVENOUS | Status: DC

## 2022-01-27 MED ORDER — ONDANSETRON HCL 4 MG/2ML IJ SOLN
INTRAMUSCULAR | Status: AC
  Filled 2022-01-27: qty 2

## 2022-01-27 MED ORDER — ONDANSETRON HCL 4 MG/2ML IJ SOLN
INTRAMUSCULAR | Status: DC | PRN
  Administered 2022-01-27: 4 mg via INTRAVENOUS

## 2022-01-27 MED ORDER — AMISULPRIDE (ANTIEMETIC) 5 MG/2ML IV SOLN: 10.0000 mg | Freq: Once | INTRAVENOUS | Status: DC | PRN

## 2022-01-27 SURGICAL SUPPLY — 70 items
APL SKNCLS STERI-STRIP NONHPOA (GAUZE/BANDAGES/DRESSINGS) ×1
BENZOIN TINCTURE PRP APPL 2/3 (GAUZE/BANDAGES/DRESSINGS) ×1 IMPLANT
BLADE EXTENDED COATED 6.5IN (ELECTRODE) ×1 IMPLANT
BLADE SURG 10 STRL SS (BLADE) IMPLANT
BLADE SURG 15 STRL LF DISP TIS (BLADE) ×1 IMPLANT
BLADE SURG 15 STRL SS (BLADE) ×1
BRIEF MESH DISP LRG (UNDERPADS AND DIAPERS) ×1 IMPLANT
COVER BACK TABLE 60X90IN (DRAPES) ×1 IMPLANT
COVER MAYO STAND STRL (DRAPES) ×1 IMPLANT
DECANTER SPIKE VIAL GLASS SM (MISCELLANEOUS) ×1 IMPLANT
DRAPE HYSTEROSCOPY (MISCELLANEOUS) IMPLANT
DRAPE LAPAROTOMY 100X72 PEDS (DRAPES) ×1 IMPLANT
DRAPE SHEET LG 3/4 BI-LAMINATE (DRAPES) IMPLANT
DRAPE UTILITY XL STRL (DRAPES) ×1 IMPLANT
DRSG PAD ABDOMINAL 8X10 ST (GAUZE/BANDAGES/DRESSINGS) IMPLANT
ELECT REM PT RETURN 9FT ADLT (ELECTROSURGICAL) ×1
ELECTRODE REM PT RTRN 9FT ADLT (ELECTROSURGICAL) ×1 IMPLANT
GAUZE 4X4 16PLY ~~LOC~~+RFID DBL (SPONGE) ×1 IMPLANT
GAUZE PAD ABD 8X10 STRL (GAUZE/BANDAGES/DRESSINGS) ×1 IMPLANT
GAUZE SPONGE 4X4 12PLY STRL (GAUZE/BANDAGES/DRESSINGS) ×1 IMPLANT
GLOVE BIO SURGEON STRL SZ7.5 (GLOVE) ×1 IMPLANT
GLOVE BIOGEL PI IND STRL 8 (GLOVE) ×1 IMPLANT
GLOVE BIOGEL PI INDICATOR 8 (GLOVE) ×1
GOWN STRL REUS W/TWL LRG LVL3 (GOWN DISPOSABLE) ×1 IMPLANT
GOWN STRL REUS W/TWL XL LVL3 (GOWN DISPOSABLE) ×1 IMPLANT
HYDROGEN PEROXIDE 16OZ (MISCELLANEOUS) IMPLANT
IV CATH 14GX2 1/4 (CATHETERS) IMPLANT
IV CATH 18G SAFETY (IV SOLUTION) IMPLANT
KIT SIGMOIDOSCOPE (SET/KITS/TRAYS/PACK) IMPLANT
KIT TURNOVER CYSTO (KITS) ×1 IMPLANT
LEGGING LITHOTOMY PAIR STRL (DRAPES) IMPLANT
LIGASURE 7.4 SM JAW OPEN (ELECTROSURGICAL) IMPLANT
LOOP VESSEL MAXI BLUE (MISCELLANEOUS) IMPLANT
NDL HYPO 25X1 1.5 SAFETY (NEEDLE) IMPLANT
NDL SAFETY ECLIP 18X1.5 (MISCELLANEOUS) IMPLANT
NEEDLE HYPO 22GX1.5 SAFETY (NEEDLE) ×1 IMPLANT
NEEDLE HYPO 25X1 1.5 SAFETY (NEEDLE) IMPLANT
NS IRRIG 500ML POUR BTL (IV SOLUTION) ×1 IMPLANT
PACK BASIN DAY SURGERY FS (CUSTOM PROCEDURE TRAY) ×1 IMPLANT
PAD ARMBOARD 7.5X6 YLW CONV (MISCELLANEOUS) IMPLANT
PENCIL SMOKE EVACUATOR (MISCELLANEOUS) ×1 IMPLANT
SPONGE HEMORRHOID 8X3CM (HEMOSTASIS) IMPLANT
SPONGE SURGIFOAM ABS GEL 100 (HEMOSTASIS) IMPLANT
SPONGE SURGIFOAM ABS GEL 12-7 (HEMOSTASIS) IMPLANT
SUT CHROMIC 2 0 SH (SUTURE) IMPLANT
SUT CHROMIC 3 0 SH 27 (SUTURE) IMPLANT
SUT MNCRL AB 4-0 PS2 18 (SUTURE) IMPLANT
SUT SILK 0 TIES 10X30 (SUTURE) ×1 IMPLANT
SUT SILK 2 0 (SUTURE)
SUT SILK 2 0 SH (SUTURE) ×1 IMPLANT
SUT SILK 2-0 18XBRD TIE 12 (SUTURE) IMPLANT
SUT VIC AB 2-0 SH 27 (SUTURE)
SUT VIC AB 2-0 SH 27XBRD (SUTURE) IMPLANT
SUT VIC AB 2-0 UR6 27 (SUTURE) ×1 IMPLANT
SUT VIC AB 3-0 SH 18 (SUTURE) IMPLANT
SUT VIC AB 3-0 SH 27 (SUTURE) ×1
SUT VIC AB 3-0 SH 27X BRD (SUTURE) ×1 IMPLANT
SUT VIC AB 3-0 SH 27XBRD (SUTURE) IMPLANT
SUT VIC AB 4-0 P-3 18XBRD (SUTURE) IMPLANT
SUT VIC AB 4-0 P3 18 (SUTURE)
SYR 20ML LL LF (SYRINGE) IMPLANT
SYR BULB EAR ULCER 3OZ GRN STR (SYRINGE) IMPLANT
SYR BULB IRRIG 60ML STRL (SYRINGE) ×1 IMPLANT
SYR CONTROL 10ML LL (SYRINGE) ×1 IMPLANT
SYR TB 1ML LL NO SAFETY (SYRINGE) IMPLANT
TOWEL OR 17X26 10 PK STRL BLUE (TOWEL DISPOSABLE) ×1 IMPLANT
TRAY DSU PREP LF (CUSTOM PROCEDURE TRAY) ×1 IMPLANT
TUBE CONNECTING 12X1/4 (SUCTIONS) ×1 IMPLANT
WATER STERILE IRR 500ML POUR (IV SOLUTION) IMPLANT
YANKAUER SUCT BULB TIP NO VENT (SUCTIONS) ×1 IMPLANT

## 2022-01-27 NOTE — Transfer of Care (Signed)
Immediate Anesthesia Transfer of Care Note  Patient: RAYMAR JOINER  Procedure(s) Performed: HEMORRHOIDECTOMY TWO COLUMN (Rectum) RECTAL EXAM UNDER ANESTHESIA (Rectum)  Patient Location: PACU  Anesthesia Type:General  Level of Consciousness: awake, alert , oriented and patient cooperative  Airway & Oxygen Therapy: Patient Spontanous Breathing  Post-op Assessment: Report given to RN and Post -op Vital signs reviewed and stable  Post vital signs: Reviewed and stable  Last Vitals:  Vitals Value Taken Time  BP 128/84 01/27/22 0937  Temp 36.8 C 01/27/22 0936  Pulse 98 01/27/22 0940  Resp 18 01/27/22 0940  SpO2 93 % 01/27/22 0940  Vitals shown include unvalidated device data.  Last Pain:  Vitals:   01/27/22 0703  TempSrc: Oral      Patients Stated Pain Goal: 4 (01/27/22 0703)  Complications: No notable events documented.

## 2022-01-27 NOTE — Anesthesia Procedure Notes (Signed)
Procedure Name: LMA Insertion Date/Time: 01/27/2022 8:44 AM  Performed by: Bishop Limbo, CRNAPre-anesthesia Checklist: Patient identified, Emergency Drugs available, Suction available and Patient being monitored Patient Re-evaluated:Patient Re-evaluated prior to induction Oxygen Delivery Method: Circle System Utilized Preoxygenation: Pre-oxygenation with 100% oxygen Induction Type: IV induction Ventilation: Mask ventilation without difficulty LMA: LMA inserted LMA Size: 5.0 Number of attempts: 1 Placement Confirmation: positive ETCO2 Tube secured with: Tape Dental Injury: Teeth and Oropharynx as per pre-operative assessment

## 2022-01-27 NOTE — Op Note (Signed)
01/27/2022  9:34 AM  PATIENT:  Gary Pratt  51 y.o. male  Patient Care Team: Ollen Bowl, MD as PCP - General (Internal Medicine) O'Neal, Ronnald Ramp, MD as PCP - Cardiology (Cardiology)  PRE-OPERATIVE DIAGNOSIS:  Medically refractory recurrent hemorrhoids  POST-OPERATIVE DIAGNOSIS:  Same  PROCEDURE:   Hemorrhoidectomy left posterior Hemorrhoidectomy right posterior Anorectal exam under anesthesia  SURGEON:  Surgeon(s): Andria Meuse, MD  ASSISTANT: OR staff  ANESTHESIA:   local and general  SPECIMEN:   Hemorrhoidal tissue, left posterior Hemorrhoidal tissue, right posterior  DISPOSITION OF SPECIMEN:  PATHOLOGY  COUNTS:  Sponge, needle, and instrument counts were reported correct x2 at conclusion.  EBL: 10 mL  PLAN OF CARE: Discharge to home after PACU  PATIENT DISPOSITION:  PACU - hemodynamically stable.  OR FINDINGS: With the exception of hemorrhoidal tissue, normal-appearing anoderm without inflammation or granulation tissue.  Normal perianal skin.  Large mixed internal/external hemorrhoid in the left posterior position that freely prolapses.  Large predominantly internal hemorrhoid in the right posterior position.  Circumferential skin tags which are all decompressed and soft.  A 2 column hemorrhoidectomy was carried out which appears to address all of his dominant hemorrhoidal disease.  External skin tags left in situ given that this is a recurrent hemorrhoidectomy procedure so as to minimize his chance for anal stenosis.  DESCRIPTION: The patient was identified in the preoperative holding area and taken to the OR where he was placed on the operating room table. SCDs were placed.  General endotracheal anesthesia was induced without difficulty. The patient was then positioned in high lithotomy with Allen stirrups. Pressure points were then evaluated and padded.  He was then prepped and draped in usual sterile fashion.  A surgical timeout was performed  indicating the correct patient, procedure, and positioning.  A perianal block was performed using a dilute mixture of 0.25% Marcaine with epinephrine and Exparel.   After ascertaining that an appropriate level of anesthesia had been achieved, a well lubricated digital rectal exam was performed. This demonstrated no palpable masses.  Anal margin skin is normal in appearance.  A Hill-Ferguson anoscope was into the anal canal and circumferential inspection demonstrated large mixed internal/external hemorrhoidal tissue in the left posterior position.  Large internal hemorrhoidal tissue in the right posterior position.  Externally, he does have soft/decompressed skin tags circumferentially.  There is no evidence on exam today of any obvious degree of anal stenosis.  Attention was directed at the left posterior hemorrhoidal tissue first.  With a Hill-Ferguson in place, this is elevated with a DeBakey forcep.  Margins of excision are marked and the anoderm is incised sharply.  The underlying external and internal sphincter tissue is dissected away such that no muscle fibers are divided.  As we approached the internal component and had dissected the sphincter tissue away, a hand-held LigaSure device was utilized to complete the hemorrhoidectomy so as to reduce his chance for any postoperative hemorrhoidal bleeding given how engorged this tissue is.  The specimen is fully excised and passed off.  The hemorrhoidal defect is then closed using a 2-0 Vicryl figure-of-eight stitch at the apex, followed by a 2-0 Vicryl running locking suture.  As we approached the external endoderm, the hemorrhoidal defect is closed using a 3-0 Vicryl running suture.  The anal canal is irrigated and hemostasis is verified.  Attention is then directed at the right posterior hemorrhoidal tissue. With a Hill-Ferguson in place, this is elevated with a DeBakey forcep.  Margins of  excision are marked incorporating all visible hemorrhoidal tissue  at this location.  This is then incised sharply.  The proximal aspect of the external sphincter followed by the internal sphincter muscle is again dissected away.  As we approached the pedicle, this is ligated using the hand-held LigaSure device.  The tissue was passed off the specimen, right posterior hemorrhoidal tissue.  A 2-0 Vicryl figure-of-eight stitch is placed at the apex.  The hemorrhoidal defect was then closed using a running 2-0 Vicryl suture.  The anal canal was then irrigated and hemostasis is verified.   Circumferential inspection with a Hill-Ferguson anoscope demonstrates no obvious or significant hemorrhoidal tissue remaining.  He does have his known external tags which again are soft and decompressed.  We opted to leave all of these in situ given his history of having had hemorrhoidectomy surgery in the past and in an effort to minimize any chance of anal stenosis.  All sponge, needle, and counts were reported correct.  A dressing consisting of 4 x 4's, ABD, mesh underwear was placed.  He was taken out of the lithotomy position, awakened from anesthesia, extubated, and transferred to a stretcher for transport to recovery in satisfactory condition.  DISPOSITION: PACU in satisfactory condition.

## 2022-01-27 NOTE — Anesthesia Postprocedure Evaluation (Signed)
Anesthesia Post Note  Patient: Gary Pratt  Procedure(s) Performed: HEMORRHOIDECTOMY TWO COLUMN (Rectum) RECTAL EXAM UNDER ANESTHESIA (Rectum)     Patient location during evaluation: PACU Anesthesia Type: General Level of consciousness: awake and alert, oriented and patient cooperative Pain management: pain level controlled Vital Signs Assessment: post-procedure vital signs reviewed and stable Respiratory status: spontaneous breathing, nonlabored ventilation and respiratory function stable Cardiovascular status: blood pressure returned to baseline and stable Postop Assessment: no apparent nausea or vomiting Anesthetic complications: no   No notable events documented.  Last Vitals:  Vitals:   01/27/22 1000 01/27/22 1015  BP: 124/77 (!) 126/95  Pulse: 78 87  Resp: 14 14  Temp: (!) 36.4 C (!) 36.4 C  SpO2: 93% 93%    Last Pain:  Vitals:   01/27/22 1015  TempSrc: Oral  PainSc: 0-No pain                 Lannie Fields

## 2022-01-27 NOTE — Discharge Instructions (Addendum)
ANORECTAL SURGERY: POST OP INSTRUCTIONS  DIET: Follow a light bland diet the first 24 hours after arrival home, such as soup, liquids, crackers, etc.  Be sure to include lots of fluids daily.  Avoid fast food or heavy meals as your are more likely to get nauseated.  Eat a low fat diet the next few days after surgery.   Some bleeding with bowel movements is expected for the first couple of days but this should stop in between bowel movements  Take your usually prescribed home medications unless otherwise directed. No foreign bodies per rectum for the next 3 months (enemas, etc)  PAIN CONTROL: It is helpful to take an over-the-counter pain medication regularly for the first few days/weeks.  Choose from the following that works best for you: Ibuprofen (Advil, etc) Three 200mg tabs every 6 hours as needed. Acetaminophen (Tylenol, etc) 500-650mg every 6 hours as needed NOTE: You may take both of these medications together - most patients find it most helpful when alternating between the two (i.e. Ibuprofen at 6am, tylenol at 9am, ibuprofen at 12pm ...) A  prescription for pain medication may have been prescribed for you at discharge.  Take your pain medication as prescribed.  If you are having problems/concerns with the prescription medicine, please call us for further advice.  Avoid getting constipated.  Between the surgery and the pain medications, it is common to experience some constipation.  Increasing fluid intake (64oz of water per day) and taking a fiber supplement (such as Metamucil, Citrucel, FiberCon) 1-2 times a day regularly will usually help prevent this problem from occurring.  Take Miralax (over the counter) 1-2x/day while taking a narcotic pain medication. If no bowel movement after 48hours, you may additionally take a laxative like a bottle of Milk of Magnesia which can be purchased over the counter. Avoid enemas.   Watch out for diarrhea.  If you have many loose bowel movements,  simplify your diet to bland foods.  Stop any stool softeners and decrease your fiber supplement. If this worsens or does not improve, please call us.  Wash / shower every day.  If you were discharged with a dressing, you may remove this the day after your surgery. You may shower normally, getting soap/water on your wound, particularly after bowel movements.  Soaking in a warm bath filled a couple inches ("Sitz bath") is a great way to clean the area after a bowel movement and many patients find it is a way to soothe the area.  ACTIVITIES as tolerated:   You may resume regular (light) daily activities beginning the next day--such as daily self-care, walking, climbing stairs--gradually increasing activities as tolerated.  If you can walk 30 minutes without difficulty, it is safe to try more intense activity such as jogging, treadmill, bicycling, low-impact aerobics, etc. Refrain from any heavy lifting or straining for the first 2 weeks after your procedure, particularly if your surgery was for hemorrhoids. Avoid activities that make your pain worse You may drive when you are no longer taking prescription pain medication, you can comfortably wear a seatbelt, and you can safely maneuver your car and apply brakes.  FOLLOW UP in our office Please call [facility] at [phone] to set up an appointment to see your surgeon in the office for a follow-up appointment approximately 2 weeks after your surgery. Make sure that you call for this appointment the day you arrive home to insure a convenient appointment time.  9. If you have disability or family leave forms   that need to be completed, you may have them completed by your primary care physician's office; for return to work instructions, please ask our office staff and they will be happy to assist you in obtaining this documentation   When to call us [phone]: Poor pain control Reactions / problems with new medications (rash/itching, etc)  Fever over  101.5 F (38.5 C) Inability to urinate Nausea/vomiting Worsening swelling or bruising Continued bleeding from incision. Increased pain, redness, or drainage from the incision  The clinic staff is available to answer your questions during regular business hours (8:30am-5pm).  Please don't hesitate to call and ask to speak to one of our nurses for clinical concerns.   A surgeon from [facility] is always on call at the hospitals   If you have a medical emergency, go to the nearest emergency room or call 911.   [facility] [address], [address], [place], [place]  [address] MAIN: [phone] FAX: [phone] [email or web address]   Information for Discharge Teaching: EXPAREL (bupivacaine liposome injectable suspension)   Your surgeon or anesthesiologist gave you EXPAREL(bupivacaine) to help control your pain after surgery.  EXPAREL is a local anesthetic that provides pain relief by numbing the tissue around the surgical site. EXPAREL is designed to release pain medication over time and can control pain for up to 72 hours. Depending on how you respond to EXPAREL, you may require less pain medication during your recovery.  Possible side effects: Temporary loss of sensation or ability to move in the area where bupivacaine was injected. Nausea, vomiting, constipation Rarely, numbness and tingling in your mouth or lips, lightheadedness, or anxiety may occur. Call your doctor right away if you think you may be experiencing any of these sensations, or if you have other questions regarding possible side effects.  Follow all other discharge instructions given to you by your surgeon or nurse. Eat a healthy diet and drink plenty of water or other fluids.  If you return to the hospital for any reason within 96 hours following the administration of EXPAREL, it is important for health care providers to know that you have received  this anesthetic. A teal colored band has been placed on your arm with the date, time and amount of EXPAREL you have received in order to alert and inform your health care providers. Please leave this armband in place for the full 96 hours following administration, and then you may remove the band.  Post Anesthesia Home Care Instructions  Activity: Get plenty of rest for the remainder of the day. A responsible individual must stay with you for 24 hours following the procedure.  For the next 24 hours, DO NOT: -Drive a car -Advertising copywriter -Drink alcoholic beverages -Take any medication unless instructed by your physician -Make any legal decisions or sign important papers.  Meals: Start with liquid foods such as gelatin or soup. Progress to regular foods as tolerated. Avoid greasy, spicy, heavy foods. If nausea and/or vomiting occur, drink only clear liquids until the nausea and/or vomiting subsides. Call your physician if vomiting continues.  Special Instructions/Symptoms: Your throat may feel dry or sore from the anesthesia or the breathing tube placed in your throat during surgery. If this causes discomfort, gargle with warm salt water. The discomfort should disappear within 24 hours.  No acetaminophen/Tylenol until after 1 pm today if needed.  No ibuprofen, Advil, Aleve, Motrin, ketorolac, meloxicam, naproxen, or other NSAIDS until after 3:30 pm today if  needed.

## 2022-01-27 NOTE — H&P (Signed)
CC: Here today for surgery  HPI: Gary Pratt is an 50 y.o. male with history of HTN, whom was seen in the office for evaluation of possible hemorrhoids.  He reports that he underwent a hemorrhoidectomy back in 2004 with Dr. Rosana Hoes. Following this, he reported complete resolution of all of his symptoms with regards to tissue prolapse and bleeding until approximately 2 to 3 years ago. Since that time, he has had recurrent issues with tissue prolapse and bleeding. He will sometimes try to reduce one of the hemorrhoids and it will often stay in for a period time and then come back out.  He is not currently taking any fiber supplements or laxatives. He drinks about 60 ounces of water per day. He spends about 15 to 20 minutes on the commode. He has a bowel movement every other day and it is variable in consistency. He reports significant hygiene related issues due to the hemorrhoidal tissue that makes it difficult to get clean and he will spend 10 to 15 minutes wiping.  He denies any issues with incontinence to gas, liquid, or solid stool.  Denies any changes in his health or health history since we met in the office. States he is ready for surgery!  PMH: HTN  PSH: Hemorrhoidectomy 05/16/2003 - Dr. Lennie Hummer  History reviewed. No pertinent past medical history.  Past Surgical History:  Procedure Laterality Date   FACIAL FRACTURE SURGERY     HEMORROIDECTOMY  2004    Family History  Problem Relation Age of Onset   Heart attack Father 71   Heart attack Paternal Grandmother    Heart attack Paternal Grandfather     Social:  reports that he has been smoking cigarettes. He has a 24.00 pack-year smoking history. He has never used smokeless tobacco. He reports current alcohol use of about 6.0 standard drinks of alcohol per week. He reports that he does not use drugs.  Allergies:  Allergies  Allergen Reactions   Sulfa Antibiotics Hives and Rash    Medications: I have reviewed the  patient's current medications.  Results for orders placed or performed during the hospital encounter of 01/27/22 (from the past 48 hour(s))  CBC with Differential/Platelet     Status: Abnormal   Collection Time: 01/27/22  7:20 AM  Result Value Ref Range   WBC 7.3 4.0 - 10.5 K/uL   RBC 5.61 4.22 - 5.81 MIL/uL   Hemoglobin 19.6 (H) 13.0 - 17.0 g/dL   HCT 57.8 (H) 39.0 - 52.0 %   MCV 103.0 (H) 80.0 - 100.0 fL   MCH 34.9 (H) 26.0 - 34.0 pg   MCHC 33.9 30.0 - 36.0 g/dL   RDW 13.8 11.5 - 15.5 %   Platelets 191 150 - 400 K/uL   nRBC 0.0 0.0 - 0.2 %   Neutrophils Relative % 64 %   Neutro Abs 4.7 1.7 - 7.7 K/uL   Lymphocytes Relative 25 %   Lymphs Abs 1.8 0.7 - 4.0 K/uL   Monocytes Relative 8 %   Monocytes Absolute 0.6 0.1 - 1.0 K/uL   Eosinophils Relative 2 %   Eosinophils Absolute 0.2 0.0 - 0.5 K/uL   Basophils Relative 1 %   Basophils Absolute 0.1 0.0 - 0.1 K/uL   Immature Granulocytes 0 %   Abs Immature Granulocytes 0.01 0.00 - 0.07 K/uL    Comment: Performed at [facility], [place] [address], [place], [place] [address]  Basic metabolic panel     Status:  Abnormal   Collection Time: 01/27/22  7:20 AM  Result Value Ref Range   Sodium 141 135 - 145 mmol/L   Potassium 3.4 (L) 3.5 - 5.1 mmol/L   Chloride 110 98 - 111 mmol/L   CO2 25 22 - 32 mmol/L   Glucose, Bld 101 (H) 70 - 99 mg/dL    Comment: Glucose reference range applies only to samples taken after fasting for at least 8 hours.   BUN 10 6 - 20 mg/dL   Creatinine, Ser 0.84 0.61 - 1.24 mg/dL   Calcium 9.1 8.9 - 10.3 mg/dL   GFR, Estimated >60 >60 mL/min    Comment: (NOTE) Calculated using the CKD-EPI Creatinine Equation (2021)    Anion gap 6 5 - 15    Comment: Performed at [facility], [place] [address], [place], [place] [address]    No results found.  ROS - all of the below systems have been reviewed with the patient and positives are indicated with bold text General: chills, fever or  night sweats Eyes: blurry vision or double vision ENT: epistaxis or sore throat Allergy/Immunology: itchy/watery eyes or nasal congestion Hematologic/Lymphatic: bleeding problems, blood clots or swollen lymph nodes Endocrine: temperature intolerance or unexpected weight changes Breast: new or changing breast lumps or nipple discharge Resp: cough, shortness of breath, or wheezing CV: chest pain or dyspnea on exertion GI: as per HPI GU: dysuria, trouble voiding, or hematuria MSK: joint pain or joint stiffness Neuro: TIA or stroke symptoms Derm: pruritus and skin lesion changes Psych: anxiety and depression  PE Blood pressure (!) 127/90, pulse 62, temperature 97.8 F (36.6 C), temperature source Oral, resp. rate 16, height 5' 11"  (1.803 m), weight 70 kg, SpO2 99 %. Constitutional: NAD; conversant Eyes: Moist conjunctiva Neck: Trachea midline Lungs: Normal respiratory effort CV: RRR; no pitting edema Psychiatric: Appropriate affect; alert and oriented x3  Results for orders placed or performed during the hospital encounter of 01/27/22 (from the past 48 hour(s))  CBC with Differential/Platelet     Status: Abnormal   Collection Time: 01/27/22  7:20 AM  Result Value Ref Range   WBC 7.3 4.0 - 10.5 K/uL   RBC 5.61 4.22 - 5.81 MIL/uL   Hemoglobin 19.6 (H) 13.0 - 17.0 g/dL   HCT 57.8 (H) 39.0 - 52.0 %   MCV 103.0 (H) 80.0 - 100.0 fL   MCH 34.9 (H) 26.0 - 34.0 pg   MCHC 33.9 30.0 - 36.0 g/dL   RDW 13.8 11.5 - 15.5 %   Platelets 191 150 - 400 K/uL   nRBC 0.0 0.0 - 0.2 %   Neutrophils Relative % 64 %   Neutro Abs 4.7 1.7 - 7.7 K/uL   Lymphocytes Relative 25 %   Lymphs Abs 1.8 0.7 - 4.0 K/uL   Monocytes Relative 8 %   Monocytes Absolute 0.6 0.1 - 1.0 K/uL   Eosinophils Relative 2 %   Eosinophils Absolute 0.2 0.0 - 0.5 K/uL   Basophils Relative 1 %   Basophils Absolute 0.1 0.0 - 0.1 K/uL   Immature Granulocytes 0 %   Abs Immature Granulocytes 0.01 0.00 - 0.07 K/uL    Comment:  Performed at [facility], [place] [address] [place], [place] [address]  Basic metabolic panel     Status: Abnormal   Collection Time: 01/27/22  7:20 AM  Result Value Ref Range   Sodium 141 135 - 145 mmol/L   Potassium 3.4 (L) 3.5 - 5.1 mmol/L   Chloride  110 98 - 111 mmol/L   CO2 25 22 - 32 mmol/L   Glucose, Bld 101 (H) 70 - 99 mg/dL    Comment: Glucose reference range applies only to samples taken after fasting for at least 8 hours.   BUN 10 6 - 20 mg/dL   Creatinine, Ser 0.84 0.61 - 1.24 mg/dL   Calcium 9.1 8.9 - 10.3 mg/dL   GFR, Estimated >60 >60 mL/min    Comment: (NOTE) Calculated using the CKD-EPI Creatinine Equation (2021)    Anion gap 6 5 - 15    Comment: Performed at [facility], [place] [address], [place], [place] [address]    No results found.   A/P: MCKINNON GLICK is an 51 y.o. male with hx of HTN here for surgery re: hemorrhoids-grade 4 internal hemorrhoid x1; G III internal hemorrhoids; external hemorrhoids  -The anatomy and physiology of the anal canal was discussed with the patient with associated pictures. The pathophysiology of hemorrhoids was discussed at length with associated pictures and illustrations. -We have reviewed options going forward including further observation vs surgery. Discussed that beginning with medical management of his hemorrhoids including starting a daily fiber supplement such as Benefiber-1 to 2 tablespoons/day, increasing water intake working to get towards 64 ounces of water per day, and minimizing time on commode to 2 to 3 minutes may help. He has had some issues with hygiene related to his grade 4 internal hemorrhoid however and mucous. We therefore discussed at least surgical excision of this. -He would like to proceed with scheduling this. We discussed hemorrhoidectomy, anorectal exam under anesthesia. -The planned procedure, material risks (including, but not limited to, pain, bleeding, infection,  scarring, need for blood transfusion, damage to anal sphincter, incontinence of gas and/or stool, need for additional procedures, anal stenosis, rare cases of pelvic sepsis which in severe cases may require things like a colostomy, recurrence, pneumonia, heart attack, stroke, death) benefits and alternatives to surgery were discussed at length. I noted a good probability that the procedure would help improve their symptoms. The patient's questions were answered to his satisfaction, he voiced understanding and elected to proceed with surgery. Additionally, we discussed typical postoperative expectations and the recovery process.  Nadeen Landau, [facility], [place]

## 2022-01-28 ENCOUNTER — Encounter (HOSPITAL_BASED_OUTPATIENT_CLINIC_OR_DEPARTMENT_OTHER): Payer: Self-pay | Admitting: Surgery

## 2022-01-28 LAB — SURGICAL PATHOLOGY

## 2022-02-03 ENCOUNTER — Ambulatory Visit
Admission: RE | Admit: 2022-02-03 | Discharge: 2022-02-03 | Disposition: A | Discharge status: Home / Self Care | Payer: 59 | Source: Ambulatory Visit | Attending: Acute Care | Admitting: Acute Care

## 2022-02-03 DIAGNOSIS — F17210 Nicotine dependence, cigarettes, uncomplicated: Secondary | ICD-10-CM

## 2022-02-03 DIAGNOSIS — Z87891 Personal history of nicotine dependence: Secondary | ICD-10-CM

## 2022-02-05 ENCOUNTER — Other Ambulatory Visit: Payer: Self-pay

## 2022-02-05 DIAGNOSIS — Z87891 Personal history of nicotine dependence: Secondary | ICD-10-CM

## 2022-02-05 DIAGNOSIS — F17210 Nicotine dependence, cigarettes, uncomplicated: Secondary | ICD-10-CM

## 2022-02-05 DIAGNOSIS — Z122 Encounter for screening for malignant neoplasm of respiratory organs: Secondary | ICD-10-CM

## 2022-11-23 ENCOUNTER — Other Ambulatory Visit: Payer: Self-pay | Admitting: Acute Care

## 2022-11-23 DIAGNOSIS — Z122 Encounter for screening for malignant neoplasm of respiratory organs: Secondary | ICD-10-CM

## 2022-11-23 DIAGNOSIS — F17210 Nicotine dependence, cigarettes, uncomplicated: Secondary | ICD-10-CM

## 2022-11-23 DIAGNOSIS — Z87891 Personal history of nicotine dependence: Secondary | ICD-10-CM

## 2023-02-01 ENCOUNTER — Encounter: Payer: Self-pay | Admitting: Acute Care

## 2023-02-07 ENCOUNTER — Inpatient Hospital Stay: Admission: RE | Admit: 2023-02-07 | Payer: 59 | Source: Ambulatory Visit

## 2023-07-01 ENCOUNTER — Encounter: Payer: Self-pay | Admitting: Emergency Medicine

## 2023-07-26 ENCOUNTER — Ambulatory Visit (INDEPENDENT_AMBULATORY_CARE_PROVIDER_SITE_OTHER): Payer: BLUE CROSS/BLUE SHIELD | Admitting: Family Medicine

## 2023-07-26 ENCOUNTER — Encounter: Payer: Self-pay | Admitting: Family Medicine

## 2023-07-26 VITALS — BP 152/92 | HR 78 | Temp 98.3°F | Ht 71.0 in | Wt 157.0 lb

## 2023-07-26 DIAGNOSIS — B360 Pityriasis versicolor: Secondary | ICD-10-CM

## 2023-07-26 DIAGNOSIS — Z0001 Encounter for general adult medical examination with abnormal findings: Secondary | ICD-10-CM | POA: Diagnosis not present

## 2023-07-26 DIAGNOSIS — K625 Hemorrhage of anus and rectum: Secondary | ICD-10-CM | POA: Insufficient documentation

## 2023-07-26 DIAGNOSIS — I10 Essential (primary) hypertension: Secondary | ICD-10-CM | POA: Diagnosis not present

## 2023-07-26 DIAGNOSIS — Z1159 Encounter for screening for other viral diseases: Secondary | ICD-10-CM

## 2023-07-26 DIAGNOSIS — K644 Residual hemorrhoidal skin tags: Secondary | ICD-10-CM | POA: Insufficient documentation

## 2023-07-26 DIAGNOSIS — Z23 Encounter for immunization: Secondary | ICD-10-CM | POA: Diagnosis not present

## 2023-07-26 DIAGNOSIS — Z0000 Encounter for general adult medical examination without abnormal findings: Secondary | ICD-10-CM

## 2023-07-26 DIAGNOSIS — Z13220 Encounter for screening for lipoid disorders: Secondary | ICD-10-CM

## 2023-07-26 DIAGNOSIS — Z860100 Personal history of colon polyps, unspecified: Secondary | ICD-10-CM | POA: Insufficient documentation

## 2023-07-26 DIAGNOSIS — Z114 Encounter for screening for human immunodeficiency virus [HIV]: Secondary | ICD-10-CM

## 2023-07-26 DIAGNOSIS — R3911 Hesitancy of micturition: Secondary | ICD-10-CM

## 2023-07-26 DIAGNOSIS — Z1211 Encounter for screening for malignant neoplasm of colon: Secondary | ICD-10-CM | POA: Insufficient documentation

## 2023-07-26 MED ORDER — KETOCONAZOLE 2 % EX CREA: 1.0000 | TOPICAL_CREAM | Freq: Every day | CUTANEOUS | 3 refills | Status: AC

## 2023-07-26 NOTE — Assessment & Plan Note (Signed)
 DRE normal with no enlargement, nodules, or tenderness of prostate. Pt left prior to collecting urine for UA but symptoms not consistent with UTI. Declines STI testing. Will refer to urology for further evaluation.

## 2023-07-26 NOTE — Assessment & Plan Note (Signed)

## 2023-07-26 NOTE — Assessment & Plan Note (Signed)
 BP elevated today in office. No recent history of elevated BP and he is unmedicated. Will monitor at home BID and return to office in 1 week with readings and home cuff. Recommend heart healthy diet such as Mediterranean diet with whole grains, fruits, vegetable, fish, lean meats, nuts, and olive oil. Limit salt. Encouraged moderate walking, 3-5 times/week for 30-50 minutes each session. Aim for at least 150 minutes.week. Goal should be pace of 3 miles/hours, or walking 1.5 miles in 30 minutes. Avoid tobacco products. Avoid excess alcohol. Take medications as prescribed and bring medications and blood pressure log with cuff to each office visit. Seek medical care for chest pain, palpitations, shortness of breath with exertion, dizziness/lightheadedness, vision changes, recurrent headaches, or swelling of extremities. Follow up in 1 week.

## 2023-07-26 NOTE — Progress Notes (Signed)
 New Patient Office Visit  Subjective    Patient ID: Gary Pratt, male    DOB: Oct 03, 1970  Age: 53 y.o. MRN: [id number]  CC:  Chief Complaint  Patient presents with   Establish Care    HPI Gary Pratt presents to establish care. Oriented to practice routines and expectations. Has not seen PCP in 2 years. PMH includes HTN, hemorrhoids, precordial pain, former smoker. Concerns include difficulty starting stream and "testicular pressure". Denies dysuria, abdominal pain, fever, hematuria, rectal pressure, flank pain,   Colon CA screening: colonoscopy 2 years ago  with benign polyps  abnormalities. Repeat 5y. Tobacco: smoker  (0.5-2 ppd x 35 yrs) ETOH: 6-12 beers per week Drugs: denies STI: declines Vaccines:  tdap today, declines flu, pna   HYPERTENSION without Chronic Kidney Disease Hypertension status: uncontrolled  Satisfied with current treatment?  unmedicated Duration of hypertension:  unsure BP monitoring frequency:  weekly BP range: unsure BP medication side effects:   unmedicated Medication compliance:  unmedicated Previous BP meds: none Aspirin: no Recurrent headaches: yes Visual changes: no Palpitations: no Dyspnea: no Chest pain: no Lower extremity edema: no Dizzy/lightheaded: no    Outpatient Encounter Medications as of 07/26/2023  Medication Sig   ketoconazole (NIZORAL) 2 % cream Apply 1 Application topically daily. To affected areas.   metoprolol tartrate (LOPRESSOR) 50 MG tablet Take 1 tablet (50 mg total) by mouth as directed. Take 2 Hours prior to CT Scan   No facility-administered encounter medications on file as of 07/26/2023.    History reviewed. No pertinent past medical history.  Past Surgical History:  Procedure Laterality Date   FACIAL FRACTURE SURGERY     HEMORRHOID SURGERY N/A 01/27/2022   Procedure: HEMORRHOIDECTOMY TWO COLUMN;  Surgeon: Andria Meuse, MD;  Location: [facility];  Service: General;   Laterality: N/A;   HEMORROIDECTOMY  2004   RECTAL EXAM UNDER ANESTHESIA N/A 01/27/2022   Procedure: RECTAL EXAM UNDER ANESTHESIA;  Surgeon: Andria Meuse, MD;  Location: [facility];  Service: General;  Laterality: N/A;    Family History  Problem Relation Age of Onset   Heart attack Father 60   Heart attack Paternal Grandmother    Heart attack Paternal Grandfather     Social History   Socioeconomic History   Marital status: Significant Other    Spouse name: Not on file   Number of children: Not on file   Years of education: Not on file   Highest education level: Associate degree: occupational, Scientist, product/process development, or vocational program  Occupational History   Not on file  Tobacco Use   Smoking status: Every Day    Current packs/day: 0.75    Average packs/day: 0.8 packs/day for 32.0 years (24.0 ttl pk-yrs)    Types: Cigarettes   Smokeless tobacco: Never  Substance and Sexual Activity   Alcohol use: Yes    Alcohol/week: 6.0 standard drinks of alcohol    Types: 6 Cans of beer per week    Comment: weekends only   Drug use: Never   Sexual activity: Never  Other Topics Concern   Not on file  Social History Narrative   Works at [employer]    Social Drivers of [employer]: Low Risk  (07/21/2023)   Overall Financial Resource Strain (CARDIA)    Difficulty of Paying Living Expenses: Not hard at all  Food Insecurity: No Food Insecurity (07/21/2023)   Hunger Vital Sign    Worried About Running Out  of Food in the Last Year: Never true    Ran Out of Food in the Last Year: Never true  Transportation Needs: No Transportation Needs (07/21/2023)   PRAPARE - Administrator, Civil Service (Medical): No    Lack of Transportation (Non-Medical): No  Physical Activity: Insufficiently Active (07/21/2023)   Exercise Vital Sign    Days of Exercise per Week: 3 days    Minutes of Exercise per Session: 30 min  Stress: Stress Concern Present  (07/21/2023)   [employer] of Occupational Health - Occupational Stress Questionnaire    Feeling of Stress : Rather much  Social Connections: Moderately Integrated (07/21/2023)   Social Connection and Isolation Panel [NHANES]    Frequency of Communication with Friends and Family: More than three times a week    Frequency of Social Gatherings with Friends and Family: More than three times a week    Attends Religious Services: More than 4 times per year    Active Member of [employer] or Organizations: No    Attends Banker Meetings: Not on file    Marital Status: Living with partner  Intimate Partner Violence: Not At Risk (10/16/2017)   Received from [facility], [facility]   Humiliation, Afraid, Rape, and Kick questionnaire    Fear of Current or Ex-Partner: No    Emotionally Abused: No    Physically Abused: No    Sexually Abused: No    Review of Systems  Constitutional: Negative.   HENT: Negative.    Eyes: Negative.   Respiratory: Negative.    Cardiovascular: Negative.   Gastrointestinal: Negative.   Genitourinary: Negative.        Hesitancy  Musculoskeletal: Negative.   Skin:  Positive for rash.  Neurological: Negative.   Endo/Heme/Allergies: Negative.   Psychiatric/Behavioral: Negative.    All other systems reviewed and are negative.       Objective    BP (!) 152/92   Pulse 78   Temp 98.3 F (36.8 C) (Oral)   Ht 5\' 11"  (1.803 m)   Wt 157 lb (71.2 kg)   SpO2 98%   BMI 21.90 kg/m   Physical Exam Vitals and nursing note reviewed. Exam conducted with a chaperone present.  Constitutional:      Appearance: Normal appearance. He is normal weight.  HENT:     Head: Normocephalic and atraumatic.     Right Ear: Tympanic membrane, ear canal and external ear normal.     Left Ear: Tympanic membrane, ear canal and external ear normal.     Nose: Nose normal.     Mouth/Throat:     Mouth: Mucous membranes are moist.     Pharynx: Oropharynx is clear.   Eyes:     Extraocular Movements: Extraocular movements intact.     Right eye: Normal extraocular motion and no nystagmus.     Left eye: Normal extraocular motion and no nystagmus.     Conjunctiva/sclera: Conjunctivae normal.     Pupils: Pupils are equal, round, and reactive to light.  Cardiovascular:     Rate and Rhythm: Normal rate and regular rhythm.     Pulses: Normal pulses.     Heart sounds: Normal heart sounds.  Pulmonary:     Effort: Pulmonary effort is normal.     Breath sounds: Normal breath sounds.  Abdominal:     General: Bowel sounds are normal.     Palpations: Abdomen is soft.  Genitourinary:    Penis: Normal.  Testes: Normal.     Epididymis:     Right: Normal.     Left: Normal.     Prostate: Normal. Not enlarged, not tender and no nodules present.     Rectum: External hemorrhoid present.     Comments: Deferred using shared decision making Musculoskeletal:        General: Normal range of motion.     Cervical back: Normal range of motion and neck supple.  Skin:    General: Skin is warm and dry.     Capillary Refill: Capillary refill takes less than 2 seconds.  Neurological:     General: No focal deficit present.     Mental Status: He is alert. Mental status is at baseline.  Psychiatric:        Mood and Affect: Mood normal.        Speech: Speech normal.        Behavior: Behavior normal.        Thought Content: Thought content normal.        Cognition and Memory: Cognition and memory normal.        Judgment: Judgment normal.         Assessment & Plan:   Problem List Items Addressed This Visit     Primary hypertension   BP elevated today in office. No recent history of elevated BP and he is unmedicated. Will monitor at home BID and return to office in 1 week with readings and home cuff. Recommend heart healthy diet such as Mediterranean diet with whole grains, fruits, vegetable, fish, lean meats, nuts, and olive oil. Limit salt. Encouraged moderate  walking, 3-5 times/week for 30-50 minutes each session. Aim for at least 150 minutes.week. Goal should be pace of 3 miles/hours, or walking 1.5 miles in 30 minutes. Avoid tobacco products. Avoid excess alcohol. Take medications as prescribed and bring medications and blood pressure log with cuff to each office visit. Seek medical care for chest pain, palpitations, shortness of breath with exertion, dizziness/lightheadedness, vision changes, recurrent headaches, or swelling of extremities. Follow up in 1 week.       Relevant Orders   CBC with Differential/Platelet   COMPLETE METABOLIC PANEL WITH GFR   Lipid panel   Physical exam, annual - Primary   Today your medical history was reviewed and routine physical exam with labs was performed. Recommend 150 minutes of moderate intensity exercise weekly and consuming a well-balanced diet. Advised to stop smoking if a smoker, avoid smoking if a non-smoker, limit alcohol consumption to 1 drink per day for women and 2 drinks per day for men, and avoid illicit drug use. Counseled on safe sex practices and offered STI testing today. Counseled on the importance of sunscreen use. Counseled in mental health awareness and when to seek medical care. Vaccine maintenance discussed. Appropriate health maintenance items reviewed. Return to office in 1 year for annual physical exam.       Urinary hesitancy   DRE normal with no enlargement, nodules, or tenderness of prostate. Pt left prior to collecting urine for UA but symptoms not consistent with UTI. Declines STI testing. Will refer to urology for further evaluation.      Relevant Orders   Urinalysis, Routine w reflex microscopic   Ambulatory referral to Urology   Tinea versicolor   Hyperpigmented patches to left torso unrelieved with selenium sulfide shampoo. Start Ketoconazole 2% daily up to 2 weeks.       Relevant Medications   ketoconazole (NIZORAL) 2 % cream  Other Visit Diagnoses       Screening for  HIV (human immunodeficiency virus)       Relevant Orders   HIV Antibody (routine testing w rflx)     Need for hepatitis C screening test       Relevant Orders   Hepatitis C antibody     Screening for lipoid disorders       Relevant Orders   Lipid panel     Need for vaccination       Relevant Orders   Tdap vaccine greater than or equal to 7yo IM (Completed)       Return in about 1 week (around 08/02/2023) for hypertension.   Park Meo, FNP

## 2023-07-26 NOTE — Assessment & Plan Note (Signed)
 Hyperpigmented patches to left torso unrelieved with selenium sulfide shampoo. Start Ketoconazole 2% daily up to 2 weeks.

## 2023-07-29 ENCOUNTER — Other Ambulatory Visit: Payer: BC Managed Care – PPO

## 2023-07-29 DIAGNOSIS — Z114 Encounter for screening for human immunodeficiency virus [HIV]: Secondary | ICD-10-CM | POA: Diagnosis not present

## 2023-07-29 DIAGNOSIS — R3911 Hesitancy of micturition: Secondary | ICD-10-CM | POA: Diagnosis not present

## 2023-07-29 DIAGNOSIS — I10 Essential (primary) hypertension: Secondary | ICD-10-CM | POA: Diagnosis not present

## 2023-07-29 DIAGNOSIS — Z13220 Encounter for screening for lipoid disorders: Secondary | ICD-10-CM | POA: Diagnosis not present

## 2023-07-29 DIAGNOSIS — Z1159 Encounter for screening for other viral diseases: Secondary | ICD-10-CM | POA: Diagnosis not present

## 2023-07-30 LAB — COMPLETE METABOLIC PANEL WITH GFR
AG Ratio: 1.7 (calc) (ref 1.0–2.5)
ALT: 35 U/L (ref 9–46)
AST: 22 U/L (ref 10–35)
Albumin: 4.1 g/dL (ref 3.6–5.1)
Alkaline phosphatase (APISO): 107 U/L (ref 35–144)
BUN: 9 mg/dL (ref 7–25)
CO2: 28 mmol/L (ref 20–32)
Calcium: 9.4 mg/dL (ref 8.6–10.3)
Chloride: 104 mmol/L (ref 98–110)
Creat: 0.97 mg/dL (ref 0.70–1.30)
Globulin: 2.4 g/dL (ref 1.9–3.7)
Glucose, Bld: 96 mg/dL (ref 65–99)
Potassium: 3.8 mmol/L (ref 3.5–5.3)
Sodium: 139 mmol/L (ref 135–146)
Total Bilirubin: 0.5 mg/dL (ref 0.2–1.2)
Total Protein: 6.5 g/dL (ref 6.1–8.1)
eGFR: 93 mL/min/{1.73_m2} (ref >=60)

## 2023-07-30 LAB — CBC WITH DIFFERENTIAL/PLATELET
Absolute Lymphocytes: 1558 {cells}/uL (ref 850–3900)
Absolute Monocytes: 620 {cells}/uL (ref 200–950)
Basophils Absolute: 40 {cells}/uL (ref 0–200)
Basophils Relative: 0.6 %
Eosinophils Absolute: 198 {cells}/uL (ref 15–500)
Eosinophils Relative: 3 %
HCT: 51.4 % — ABNORMAL HIGH (ref 38.5–50.0)
Hemoglobin: 18.1 g/dL — ABNORMAL HIGH (ref 13.2–17.1)
MCH: 37 pg — ABNORMAL HIGH (ref 27.0–33.0)
MCHC: 35.2 g/dL (ref 32.0–36.0)
MCV: 105.1 fL — ABNORMAL HIGH (ref 80.0–100.0)
MPV: 9.5 fL (ref 7.5–12.5)
Monocytes Relative: 9.4 %
Neutro Abs: 4184 {cells}/uL (ref 1500–7800)
Neutrophils Relative %: 63.4 %
Platelets: 194 10*3/uL (ref 140–400)
RBC: 4.89 10*6/uL (ref 4.20–5.80)
RDW: 12.7 % (ref 11.0–15.0)
Total Lymphocyte: 23.6 %
WBC: 6.6 10*3/uL (ref 3.8–10.8)

## 2023-07-30 LAB — URINALYSIS, ROUTINE W REFLEX MICROSCOPIC
Bilirubin Urine: NEGATIVE
Glucose, UA: NEGATIVE
Hgb urine dipstick: NEGATIVE
Ketones, ur: NEGATIVE
Leukocytes,Ua: NEGATIVE
Nitrite: NEGATIVE
Protein, ur: NEGATIVE
Specific Gravity, Urine: 1.006 (ref 1.001–1.035)
pH: 6.5 (ref 5.0–8.0)

## 2023-07-30 LAB — LIPID PANEL
Cholesterol: 198 mg/dL (ref <200)
HDL: 45 mg/dL (ref >=40)
LDL Cholesterol (Calc): 119 mg/dL — ABNORMAL HIGH
Non-HDL Cholesterol (Calc): 153 mg/dL — ABNORMAL HIGH (ref <130)
Total CHOL/HDL Ratio: 4.4 (calc) (ref <5.0)
Triglycerides: 227 mg/dL — ABNORMAL HIGH (ref <150)

## 2023-07-30 LAB — HIV ANTIBODY (ROUTINE TESTING W REFLEX): HIV 1&2 Ab, 4th Generation: NONREACTIVE

## 2023-07-30 LAB — HEPATITIS C ANTIBODY: Hepatitis C Ab: NONREACTIVE

## 2023-08-02 ENCOUNTER — Ambulatory Visit (INDEPENDENT_AMBULATORY_CARE_PROVIDER_SITE_OTHER): Payer: BC Managed Care – PPO | Admitting: Family Medicine

## 2023-08-02 ENCOUNTER — Encounter: Payer: Self-pay | Admitting: Family Medicine

## 2023-08-02 VITALS — BP 150/100 | HR 76 | Temp 98.4°F | Ht 71.0 in | Wt 158.0 lb

## 2023-08-02 DIAGNOSIS — E782 Mixed hyperlipidemia: Secondary | ICD-10-CM | POA: Diagnosis not present

## 2023-08-02 DIAGNOSIS — I10 Essential (primary) hypertension: Secondary | ICD-10-CM | POA: Diagnosis not present

## 2023-08-02 DIAGNOSIS — F17210 Nicotine dependence, cigarettes, uncomplicated: Secondary | ICD-10-CM | POA: Insufficient documentation

## 2023-08-02 HISTORY — DX: Mixed hyperlipidemia: E78.2

## 2023-08-02 MED ORDER — LISINOPRIL 5 MG PO TABS: 5.0000 mg | ORAL_TABLET | Freq: Every day | ORAL | 1 refills | Status: DC

## 2023-08-02 MED ORDER — NICOTINE 21 MG/24HR TD PT24: 21.0000 mg | MEDICATED_PATCH | Freq: Every day | TRANSDERMAL | 0 refills | Status: AC

## 2023-08-02 MED ORDER — ROSUVASTATIN CALCIUM 5 MG PO TABS: 5.0000 mg | ORAL_TABLET | Freq: Every day | ORAL | 1 refills | Status: DC

## 2023-08-02 NOTE — Progress Notes (Signed)
 Subjective:  HPI: Gary Pratt is a 53 y.o. male presenting on 08/02/2023 for Follow-up (1 week f/u (around 08/02/23) - JBG\\\)   HPI Patient is in today for blood pressure follow up. He has been monitoring his blood pressure at home and readings have been 150/100, 127/87, 144/84, 120/83, 147/96, 138/92, 143/104, 145/98, 141/96, 147/98, 145/98. He does have family history of MI in his father in his 60s. Current every day 0.5ppd smoker   HYPERTENSION / HYPERLIPIDEMIA Satisfied with current treatment?  unmedicated Duration of hypertension: chronic BP monitoring frequency: daily BP range: 150/100 - 120/83 BP medication side effects:  unmedicated Past BP meds: none Duration of hyperlipidemia: chronic Cholesterol medication side effects:  unmedicated Cholesterol supplements: none Past cholesterol medications: none and lovaza Medication compliance: unmedicated Aspirin: no Recent stressors: no Recurrent headaches: no Visual changes: no Palpitations: no Dyspnea: no Chest pain: no Lower extremity edema: no Dizzy/lightheaded: no   The 10-year ASCVD risk score (Arnett DK, et al., 2019) is: 15.2%   Values used to calculate the score:     Age: 30 years     Sex: Male     Is Non-Hispanic African American: No     Diabetic: No     Tobacco smoker: Yes     Systolic Blood Pressure: 150 mmHg     Is BP treated: Yes     HDL Cholesterol: 45 mg/dL     Total Cholesterol: 198 mg/dL   Review of Systems  All other systems reviewed and are negative.   Relevant past medical history reviewed and updated as indicated.   Past Medical History:  Diagnosis Date   Mixed hyperlipidemia 08/02/2023     Past Surgical History:  Procedure Laterality Date   FACIAL FRACTURE SURGERY     HEMORRHOID SURGERY N/A 01/27/2022   Procedure: HEMORRHOIDECTOMY TWO COLUMN;  Surgeon: Andria Meuse, MD;  Location: [facility] ;  Service: General;  Laterality: N/A;   HEMORROIDECTOMY  2004    RECTAL EXAM UNDER ANESTHESIA N/A 01/27/2022   Procedure: RECTAL EXAM UNDER ANESTHESIA;  Surgeon: Andria Meuse, MD;  Location: [facility];  Service: General;  Laterality: N/A;    Allergies and medications reviewed and updated.   Current Outpatient Medications:    ketoconazole (NIZORAL) 2 % cream, Apply 1 Application topically daily. To affected areas., Disp: 60 g, Rfl: 3   lisinopril (ZESTRIL) 5 MG tablet, Take 1 tablet (5 mg total) by mouth daily., Disp: 90 tablet, Rfl: 1   nicotine (NICODERM CQ - DOSED IN MG/24 HOURS) 21 mg/24hr patch, Place 1 patch (21 mg total) onto the skin daily., Disp: 28 patch, Rfl: 0   rosuvastatin (CRESTOR) 5 MG tablet, Take 1 tablet (5 mg total) by mouth daily., Disp: 90 tablet, Rfl: 1   metoprolol tartrate (LOPRESSOR) 50 MG tablet, Take 1 tablet (50 mg total) by mouth as directed. Take 2 Hours prior to CT Scan, Disp: 1 tablet, Rfl: 0  Allergies  Allergen Reactions   Sulfa Antibiotics Hives and Rash    Objective:   BP (!) 150/100   Pulse 76   Temp 98.4 F (36.9 C) (Oral)   Ht 5\' 11"  (1.803 m)   Wt 158 lb (71.7 kg)   SpO2 97%   BMI 22.04 kg/m      08/02/2023    3:49 PM 08/02/2023    3:48 PM 08/02/2023    3:46 PM  Vitals with BMI  Height   5\' 11"   Weight  158 lbs  BMI   22.05  Systolic 150 148   Diastolic 100 90   Pulse   76     Physical Exam Vitals and nursing note reviewed.  Constitutional:      Appearance: Normal appearance. He is normal weight.  HENT:     Head: Normocephalic and atraumatic.  Cardiovascular:     Rate and Rhythm: Normal rate and regular rhythm.     Pulses: Normal pulses.     Heart sounds: Normal heart sounds.  Pulmonary:     Effort: Pulmonary effort is normal.     Breath sounds: Normal breath sounds.  Skin:    General: Skin is warm and dry.     Capillary Refill: Capillary refill takes less than 2 seconds.  Neurological:     General: No focal deficit present.     Mental Status: He is alert and  oriented to person, place, and time. Mental status is at baseline.  Psychiatric:        Mood and Affect: Mood normal.        Behavior: Behavior normal.        Thought Content: Thought content normal.        Judgment: Judgment normal.     Assessment & Plan:  Primary hypertension -     EKG 12-Lead  Mixed hyperlipidemia  Nicotine dependence, cigarettes, uncomplicated Assessment & Plan: 3-5 minute discussion regarding the harms of tobacco use, the benefits of cessation, and methods of cessation. Discussed that there are medication options to help with cessation. Provided printed education on steps to quit smoking. Patient is ready to try a medication to help. Start Nicorine patches. Provided number for CT lung cancer screening    Other orders -     Lisinopril; Take 1 tablet (5 mg total) by mouth daily.  Dispense: 90 tablet; Refill: 1 -     Rosuvastatin Calcium; Take 1 tablet (5 mg total) by mouth daily.  Dispense: 90 tablet; Refill: 1 -     Nicotine; Place 1 patch (21 mg total) onto the skin daily.  Dispense: 28 patch; Refill: 0     Follow up plan: Return in about 4 weeks (around 08/30/2023) for hypertension.  Park Meo, FNP

## 2023-08-02 NOTE — Assessment & Plan Note (Signed)
 3-5 minute discussion regarding the harms of tobacco use, the benefits of cessation, and methods of cessation. Discussed that there are medication options to help with cessation. Provided printed education on steps to quit smoking. Patient is ready to try a medication to help. Start Nicorine patches. Provided number for CT lung cancer screening

## 2023-08-29 NOTE — Progress Notes (Signed)
 08/30/2023 8:44 PM   Gary Pratt 11-Apr-1971 [id number]  Referring provider: Park Meo, FNP [address] [place],  [place] [address]  No chief complaint on file.   HPI:53 yo male referred by Oren Beckmann, NP for E/M of LUTS.  Over the past year or 2 he has had hesitation with urination.  This has not been terribly bothersome.  Additionally, he has an intermittent stream and weak stream at times.  IPSS 18/2.  He would rather not be on any medical therapy.  There is no family history of prostate problems or prostate cancer.  No problems with the ED.    PMH: Past Medical History:  Diagnosis Date   Mixed hyperlipidemia 08/02/2023    Surgical History: Past Surgical History:  Procedure Laterality Date   FACIAL FRACTURE SURGERY     HEMORRHOID SURGERY N/A 01/27/2022   Procedure: HEMORRHOIDECTOMY TWO COLUMN;  Surgeon: Andria Meuse, MD;  Location: [facility] [place];  Service: General;  Laterality: N/A;   HEMORROIDECTOMY  2004   RECTAL EXAM UNDER ANESTHESIA N/A 01/27/2022   Procedure: RECTAL EXAM UNDER ANESTHESIA;  Surgeon: Andria Meuse, MD;  Location: [facility];  Service: General;  Laterality: N/A;    Home Medications:  Allergies as of 08/30/2023       Reactions   Sulfa Antibiotics Hives, Rash        Medication List        Accurate as of August 29, 2023  8:44 PM. If you have any questions, ask your nurse or doctor.          ketoconazole 2 % cream Commonly known as: NIZORAL Apply 1 Application topically daily. To affected areas.   lisinopril 5 MG tablet Commonly known as: ZESTRIL Take 1 tablet (5 mg total) by mouth daily.   nicotine 21 mg/24hr patch Commonly known as: NICODERM CQ - dosed in mg/24 hours Place 1 patch (21 mg total) onto the skin daily.   rosuvastatin 5 MG tablet Commonly known as: Crestor Take 1 tablet (5 mg total) by mouth daily.        Allergies:  Allergies  Allergen Reactions   Sulfa  Antibiotics Hives and Rash    Family History: Family History  Problem Relation Age of Onset   Heart attack Father 23   Heart attack Paternal Grandmother    Heart attack Paternal Grandfather     Social History:  reports that he has been smoking cigarettes. He has a 24 pack-year smoking history. He has never used smokeless tobacco. He reports current alcohol use of about 6.0 standard drinks of alcohol per week. He reports that he does not use drugs.  ROS: All other review of systems were reviewed and are negative except what is noted above in HPI  Physical Exam: There were no vitals taken for this visit.  Constitutional:  Alert and oriented, No acute distress. HEENT: [place] AT, moist mucus membranes.  Trachea midline, no masses. Cardiovascular: No clubbing, cyanosis, or edema. Respiratory: Normal respiratory effort, no increased work of breathing. GI: Small left inguinal hernia present GU: Normal phallus. No masses/lesions on penis, testis, scrotum. Prostate 30g smooth no nodules no induration.  Lymph: No cervical or inguinal lymphadenopathy. Skin: No rashes, bruises or suspicious lesions. Neurologic: Grossly intact, no focal deficits, moving all 4 extremities. Psychiatric: Normal mood and affect.  Laboratory Data: Lab Results  Component Value Date   WBC 6.6 07/29/2023   HGB 18.1 (H) 07/29/2023  HCT 51.4 (H) 07/29/2023   MCV 105.1 (H) 07/29/2023   PLT 194 07/29/2023    Lab Results  Component Value Date   CREATININE 0.97 07/29/2023    Most recent comprehensive metabolic panel reviewed-creatinine 0.97  Urinalysis    Component Value Date/Time   COLORURINE YELLOW 07/29/2023 1008   APPEARANCEUR CLEAR 07/29/2023 1008   LABSPEC 1.006 07/29/2023 1008   PHURINE 6.5 07/29/2023 1008   GLUCOSEU NEGATIVE 07/29/2023 1008   HGBUR NEGATIVE 07/29/2023 1008   KETONESUR NEGATIVE 07/29/2023 1008   PROTEINUR NEGATIVE 07/29/2023 1008   NITRITE NEGATIVE 07/29/2023 1008   LEUKOCYTESUR  NEGATIVE 07/29/2023 1008    No results found for: "LABMICR", "WBCUA", "RBCUA", "LABEPIT", "MUCUS", "BACTERIA"  No old records available for review  Urinalysis clear  IPSS sheet reviewed   Assessment: BPH with mild/moderate symptomatology, not terribly bothersome.  Normal DRE.  No evidence of prior PSA levels  Plan: Reassurance regarding his exam.  At this point I do not think medical therapy for his symptoms is warranted  I would recommend he have regular PSA checks at PCP  He would like to come back in 1 year for follow-up  There are no diagnoses linked to this encounter.  No follow-ups on file.  Chelsea Aus, MD  [facility] [place]

## 2023-08-30 ENCOUNTER — Ambulatory Visit (INDEPENDENT_AMBULATORY_CARE_PROVIDER_SITE_OTHER): Payer: BC Managed Care – PPO | Admitting: Urology

## 2023-08-30 ENCOUNTER — Encounter: Payer: Self-pay | Admitting: Family Medicine

## 2023-08-30 ENCOUNTER — Ambulatory Visit (INDEPENDENT_AMBULATORY_CARE_PROVIDER_SITE_OTHER): Admitting: Family Medicine

## 2023-08-30 VITALS — BP 124/86 | HR 75 | Temp 98.4°F | Resp 99 | Ht 71.0 in | Wt 162.2 lb

## 2023-08-30 VITALS — BP 153/82 | HR 75

## 2023-08-30 DIAGNOSIS — I10 Essential (primary) hypertension: Secondary | ICD-10-CM

## 2023-08-30 DIAGNOSIS — N401 Enlarged prostate with lower urinary tract symptoms: Secondary | ICD-10-CM | POA: Diagnosis not present

## 2023-08-30 DIAGNOSIS — R3911 Hesitancy of micturition: Secondary | ICD-10-CM

## 2023-08-30 DIAGNOSIS — E782 Mixed hyperlipidemia: Secondary | ICD-10-CM

## 2023-08-30 DIAGNOSIS — D582 Other hemoglobinopathies: Secondary | ICD-10-CM | POA: Insufficient documentation

## 2023-08-30 LAB — URINALYSIS, ROUTINE W REFLEX MICROSCOPIC
Bilirubin, UA: NEGATIVE
Glucose, UA: NEGATIVE
Ketones, UA: NEGATIVE
Leukocytes,UA: NEGATIVE
Nitrite, UA: NEGATIVE
Protein,UA: NEGATIVE
RBC, UA: NEGATIVE
Specific Gravity, UA: <1.005 — ABNORMAL LOW (ref 1.005–1.030)
Urobilinogen, Ur: 0.2 mg/dL (ref 0.2–1.0)
pH, UA: 6.5 (ref 5.0–7.5)

## 2023-08-30 NOTE — Assessment & Plan Note (Signed)
 Continue Crestro 5mg  daily. Fasting labs today. Your labs showed elevated cholesterol. I recommend consuming a heart healthy diet such as Mediterranean diet or DASH diet with whole grains, fruits, vegetable, fish, lean meats, nuts, and olive oil. Limit sweets and processed foods. I also encourage moderate intensity exercise 150 minutes weekly. This is 3-5 times weekly for 30-50 minutes each session. Goal should be pace of 3 miles/hours, or walking 1.5 miles in 30 minutes. The 10-year ASCVD risk score (Arnett DK, et al., 2019) is: 11.1% Follow up in 3 months or sooner if needed.
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
## 2023-08-30 NOTE — Assessment & Plan Note (Signed)
 Smoker. CBC today

## 2023-08-30 NOTE — Assessment & Plan Note (Signed)
 Continue Lisinopril 5mg  daily. Notify office if home readings sustain >130/80. Recommend heart healthy diet such as Mediterranean diet with whole grains, fruits, vegetable, fish, lean meats, nuts, and olive oil. Limit salt. Encouraged moderate walking, 3-5 times/week for 30-50 minutes each session. Aim for at least 150 minutes.week. Goal should be pace of 3 miles/hours, or walking 1.5 miles in 30 minutes. Avoid tobacco products. Avoid excess alcohol. Take medications as prescribed and bring medications and blood pressure log with cuff to each office visit. Seek medical care for chest pain, palpitations, shortness of breath with exertion, dizziness/lightheadedness, vision changes, recurrent headaches, or swelling of extremities. Fasting labs today, follow up in 3 months or sooner if needed.

## 2023-08-31 LAB — CBC WITH DIFFERENTIAL/PLATELET
Absolute Lymphocytes: 1534 {cells}/uL (ref 850–3900)
Absolute Monocytes: 426 {cells}/uL (ref 200–950)
Basophils Absolute: 50 {cells}/uL (ref 0–200)
Basophils Relative: 0.9 %
Eosinophils Absolute: 168 {cells}/uL (ref 15–500)
Eosinophils Relative: 3 %
HCT: 43.5 % (ref 38.5–50.0)
Hemoglobin: 15.8 g/dL (ref 13.2–17.1)
MCH: 36.5 pg — ABNORMAL HIGH (ref 27.0–33.0)
MCHC: 36.3 g/dL — ABNORMAL HIGH (ref 32.0–36.0)
MCV: 100.5 fL — ABNORMAL HIGH (ref 80.0–100.0)
MPV: 10.2 fL (ref 7.5–12.5)
Monocytes Relative: 7.6 %
Neutro Abs: 3422 {cells}/uL (ref 1500–7800)
Neutrophils Relative %: 61.1 %
Platelets: 214 10*3/uL (ref 140–400)
RBC: 4.33 10*6/uL (ref 4.20–5.80)
RDW: 11.8 % (ref 11.0–15.0)
Total Lymphocyte: 27.4 %
WBC: 5.6 10*3/uL (ref 3.8–10.8)

## 2023-08-31 LAB — COMPREHENSIVE METABOLIC PANEL WITH GFR
AG Ratio: 1.6 (calc) (ref 1.0–2.5)
ALT: 33 U/L (ref 9–46)
AST: 20 U/L (ref 10–35)
Albumin: 4.2 g/dL (ref 3.6–5.1)
Alkaline phosphatase (APISO): 106 U/L (ref 35–144)
BUN: 9 mg/dL (ref 7–25)
CO2: 24 mmol/L (ref 20–32)
Calcium: 9.1 mg/dL (ref 8.6–10.3)
Chloride: 107 mmol/L (ref 98–110)
Creat: 0.96 mg/dL (ref 0.70–1.30)
Globulin: 2.7 g/dL (ref 1.9–3.7)
Glucose, Bld: 101 mg/dL — ABNORMAL HIGH (ref 65–99)
Potassium: 4 mmol/L (ref 3.5–5.3)
Sodium: 142 mmol/L (ref 135–146)
Total Bilirubin: 0.7 mg/dL (ref 0.2–1.2)
Total Protein: 6.9 g/dL (ref 6.1–8.1)
eGFR: 95 mL/min/{1.73_m2} (ref >=60)

## 2023-08-31 LAB — LIPID PANEL
Cholesterol: 146 mg/dL (ref <200)
HDL: 52 mg/dL (ref >=40)
LDL Cholesterol (Calc): 73 mg/dL
Non-HDL Cholesterol (Calc): 94 mg/dL (ref <130)
Total CHOL/HDL Ratio: 2.8 (calc) (ref <5.0)
Triglycerides: 133 mg/dL (ref <150)

## 2023-09-29 ENCOUNTER — Encounter: Payer: Self-pay | Admitting: Acute Care

## 2023-11-29 ENCOUNTER — Encounter: Payer: Self-pay | Admitting: Family Medicine

## 2023-11-29 ENCOUNTER — Ambulatory Visit: Admitting: Family Medicine

## 2023-11-29 VITALS — BP 118/87 | HR 68 | Temp 98.4°F | Ht 71.0 in | Wt 166.8 lb

## 2023-11-29 DIAGNOSIS — I10 Essential (primary) hypertension: Secondary | ICD-10-CM | POA: Diagnosis not present

## 2023-11-29 DIAGNOSIS — E782 Mixed hyperlipidemia: Secondary | ICD-10-CM | POA: Diagnosis not present

## 2023-11-29 DIAGNOSIS — Z23 Encounter for immunization: Secondary | ICD-10-CM

## 2023-11-29 NOTE — Assessment & Plan Note (Signed)
 Continue Crestor  5mg  daily. Fasting labs today. Your labs showed elevated cholesterol. I recommend consuming a heart healthy diet such as Mediterranean diet or DASH diet with whole grains, fruits, vegetable, fish, lean meats, nuts, and olive oil. Limit sweets and processed foods. I also encourage moderate intensity exercise 150 minutes weekly. This is 3-5 times weekly for 30-50 minutes each session. Goal should be pace of 3 miles/hours, or walking 1.5 miles in 30 minutes. The 10-year ASCVD risk score (Arnett DK, et al., 2019) is: 6.1% Follow up in 6 months or sooner if needed.

## 2023-11-29 NOTE — Assessment & Plan Note (Signed)
 Continue Lisinopril  5mg  daily. Notify office if home readings sustain >130/80. Recommend heart healthy diet such as Mediterranean diet with whole grains, fruits, vegetable, fish, lean meats, nuts, and olive oil. Limit salt. Encouraged moderate walking, 3-5 times/week for 30-50 minutes each session. Aim for at least 150 minutes.week. Goal should be pace of 3 miles/hours, or walking 1.5 miles in 30 minutes. Avoid tobacco products. Avoid excess alcohol. Take medications as prescribed and bring medications and blood pressure log with cuff to each office visit. Seek medical care for chest pain, palpitations, shortness of breath with exertion, dizziness/lightheadedness, vision changes, recurrent headaches, or swelling of extremities. Fasting labs today, follow up in 6 months or sooner if needed.
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
## 2023-11-30 ENCOUNTER — Ambulatory Visit: Payer: Self-pay | Admitting: Family Medicine

## 2023-11-30 LAB — COMPREHENSIVE METABOLIC PANEL WITH GFR
AG Ratio: 1.9 (calc) (ref 1.0–2.5)
ALT: 41 U/L (ref 9–46)
AST: 17 U/L (ref 10–35)
Albumin: 4.3 g/dL (ref 3.6–5.1)
Alkaline phosphatase (APISO): 89 U/L (ref 35–144)
BUN: 7 mg/dL (ref 7–25)
CO2: 26 mmol/L (ref 20–32)
Calcium: 9.2 mg/dL (ref 8.6–10.3)
Chloride: 104 mmol/L (ref 98–110)
Creat: 1.11 mg/dL (ref 0.70–1.30)
Globulin: 2.3 g/dL (ref 1.9–3.7)
Glucose, Bld: 96 mg/dL (ref 65–99)
Potassium: 3.7 mmol/L (ref 3.5–5.3)
Sodium: 138 mmol/L (ref 135–146)
Total Bilirubin: 0.8 mg/dL (ref 0.2–1.2)
Total Protein: 6.6 g/dL (ref 6.1–8.1)
eGFR: 79 mL/min/{1.73_m2} (ref >=60)

## 2023-11-30 LAB — CBC WITH DIFFERENTIAL/PLATELET
Absolute Lymphocytes: 1859 {cells}/uL (ref 850–3900)
Absolute Monocytes: 528 {cells}/uL (ref 200–950)
Basophils Absolute: 72 {cells}/uL (ref 0–200)
Basophils Relative: 1.3 %
Eosinophils Absolute: 110 {cells}/uL (ref 15–500)
Eosinophils Relative: 2 %
HCT: 39.6 % (ref 38.5–50.0)
Hemoglobin: 13.7 g/dL (ref 13.2–17.1)
MCH: 34.5 pg — ABNORMAL HIGH (ref 27.0–33.0)
MCHC: 34.6 g/dL (ref 32.0–36.0)
MCV: 99.7 fL (ref 80.0–100.0)
MPV: 10.2 fL (ref 7.5–12.5)
Monocytes Relative: 9.6 %
Neutro Abs: 2932 {cells}/uL (ref 1500–7800)
Neutrophils Relative %: 53.3 %
Platelets: 206 10*3/uL (ref 140–400)
RBC: 3.97 10*6/uL — ABNORMAL LOW (ref 4.20–5.80)
RDW: 12.6 % (ref 11.0–15.0)
Total Lymphocyte: 33.8 %
WBC: 5.5 Thousand/uL (ref 3.8–10.8)

## 2023-11-30 LAB — LIPID PANEL
Cholesterol: 123 mg/dL (ref <200)
HDL: 42 mg/dL (ref >=40)
LDL Cholesterol (Calc): 59 mg/dL
Non-HDL Cholesterol (Calc): 81 mg/dL (ref <130)
Total CHOL/HDL Ratio: 2.9 (calc) (ref <5.0)
Triglycerides: 132 mg/dL (ref <150)

## 2024-01-10 ENCOUNTER — Other Ambulatory Visit: Payer: Self-pay | Admitting: Family Medicine

## 2024-07-25 ENCOUNTER — Other Ambulatory Visit

## 2024-07-30 ENCOUNTER — Encounter: Admitting: Family Medicine

## 2024-08-28 ENCOUNTER — Ambulatory Visit: Admitting: Urology
